# Patient Record
Sex: Female | Born: 1985 | Race: Asian | Hispanic: No | Marital: Married | Smoking: Former smoker
Health system: Southern US, Community
[De-identification: ages and names within clinical notes are randomized; demographics above are authoritative.]

## PROBLEM LIST (undated history)

## (undated) DIAGNOSIS — R51 Headache: Secondary | ICD-10-CM

## (undated) DIAGNOSIS — B019 Varicella without complication: Secondary | ICD-10-CM

## (undated) DIAGNOSIS — R519 Headache, unspecified: Secondary | ICD-10-CM

## (undated) DIAGNOSIS — G43909 Migraine, unspecified, not intractable, without status migrainosus: Secondary | ICD-10-CM

## (undated) HISTORY — PX: WISDOM TOOTH EXTRACTION: SHX21

## (undated) HISTORY — DX: Headache, unspecified: R51.9

## (undated) HISTORY — DX: Headache: R51

## (undated) HISTORY — DX: Migraine, unspecified, not intractable, without status migrainosus: G43.909

## (undated) HISTORY — DX: Varicella without complication: B01.9

---

## 2011-04-24 LAB — HM PAP SMEAR

## 2013-03-09 ENCOUNTER — Encounter: Payer: Self-pay | Admitting: Family Medicine

## 2013-05-01 ENCOUNTER — Encounter: Payer: Self-pay | Admitting: Family Medicine

## 2013-05-01 ENCOUNTER — Ambulatory Visit (INDEPENDENT_AMBULATORY_CARE_PROVIDER_SITE_OTHER): Payer: BC Managed Care – PPO | Admitting: Family Medicine

## 2013-05-01 VITALS — BP 108/64 | HR 61 | Temp 98.2°F | Ht 64.0 in | Wt 155.2 lb

## 2013-05-01 DIAGNOSIS — F17209 Nicotine dependence, unspecified, with unspecified nicotine-induced disorders: Secondary | ICD-10-CM

## 2013-05-01 DIAGNOSIS — Z0000 Encounter for general adult medical examination without abnormal findings: Secondary | ICD-10-CM

## 2013-05-01 DIAGNOSIS — F17200 Nicotine dependence, unspecified, uncomplicated: Secondary | ICD-10-CM | POA: Insufficient documentation

## 2013-05-01 NOTE — Patient Instructions (Addendum)
Preventive Care for Adults, Female A healthy lifestyle and preventive care can promote health and wellness. Preventive health guidelines for women include the following key practices.  A routine yearly physical is a good way to check with your caregiver about your health and preventive screening. It is a chance to share any concerns and updates on your health, and to receive a thorough exam.  Visit your dentist for a routine exam and preventive care every 6 months. Brush your teeth twice a day and floss once a day. Good oral hygiene prevents tooth decay and gum disease.  The frequency of eye exams is based on your age, health, family medical history, use of contact lenses, and other factors. Follow your caregiver's recommendations for frequency of eye exams.  Eat a healthy diet. Foods like vegetables, fruits, whole grains, low-fat dairy products, and lean protein foods contain the nutrients you need without too many calories. Decrease your intake of foods high in solid fats, added sugars, and salt. Eat the right amount of calories for you.Get information about a proper diet from your caregiver, if necessary.  Regular physical exercise is one of the most important things you can do for your health. Most adults should get at least 150 minutes of moderate-intensity exercise (any activity that increases your heart rate and causes you to sweat) each week. In addition, most adults need muscle-strengthening exercises on 2 or more days a week.  Maintain a healthy weight. The body mass index (BMI) is a screening tool to identify possible weight problems. It provides an estimate of body fat based on height and weight. Your caregiver can help determine your BMI, and can help you achieve or maintain a healthy weight.For adults 20 years and older:  A BMI below 18.5 is considered underweight.  A BMI of 18.5 to 24.9 is normal.  A BMI of 25 to 29.9 is considered overweight.  A BMI of 30 and above is  considered obese.  Maintain normal blood lipids and cholesterol levels by exercising and minimizing your intake of saturated fat. Eat a balanced diet with plenty of fruit and vegetables. Blood tests for lipids and cholesterol should begin at age 20 and be repeated every 5 years. If your lipid or cholesterol levels are high, you are over 50, or you are at high risk for heart disease, you may need your cholesterol levels checked more frequently.Ongoing high lipid and cholesterol levels should be treated with medicines if diet and exercise are not effective.  If you smoke, find out from your caregiver how to quit. If you do not use tobacco, do not start.  If you are pregnant, do not drink alcohol. If you are breastfeeding, be very cautious about drinking alcohol. If you are not pregnant and choose to drink alcohol, do not exceed 1 drink per day. One drink is considered to be 12 ounces (355 mL) of beer, 5 ounces (148 mL) of wine, or 1.5 ounces (44 mL) of liquor.  Avoid use of street drugs. Do not share needles with anyone. Ask for help if you need support or instructions about stopping the use of drugs.  High blood pressure causes heart disease and increases the risk of stroke. Your blood pressure should be checked at least every 1 to 2 years. Ongoing high blood pressure should be treated with medicines if weight loss and exercise are not effective.  If you are 55 to 25 years old, ask your caregiver if you should take aspirin to prevent strokes.  Diabetes   screening involves taking a blood sample to check your fasting blood sugar level. This should be done once every 3 years, after age 45, if you are within normal weight and without risk factors for diabetes. Testing should be considered at a younger age or be carried out more frequently if you are overweight and have at least 1 risk factor for diabetes.  Breast cancer screening is essential preventive care for women. You should practice "breast  self-awareness." This means understanding the normal appearance and feel of your breasts and may include breast self-examination. Any changes detected, no matter how small, should be reported to a caregiver. Women in their 20s and 30s should have a clinical breast exam (CBE) by a caregiver as part of a regular health exam every 1 to 3 years. After age 40, women should have a CBE every year. Starting at age 40, women should consider having a mammography (breast X-ray test) every year. Women who have a family history of breast cancer should talk to their caregiver about genetic screening. Women at a high risk of breast cancer should talk to their caregivers about having magnetic resonance imaging (MRI) and a mammography every year.  The Pap test is a screening test for cervical cancer. A Pap test can show cell changes on the cervix that might become cervical cancer if left untreated. A Pap test is a procedure in which cells are obtained and examined from the lower end of the uterus (cervix).  Women should have a Pap test starting at age 21.  Between ages 21 and 29, Pap tests should be repeated every 2 years.  Beginning at age 30, you should have a Pap test every 3 years as long as the past 3 Pap tests have been normal.  Some women have medical problems that increase the chance of getting cervical cancer. Talk to your caregiver about these problems. It is especially important to talk to your caregiver if a new problem develops soon after your last Pap test. In these cases, your caregiver may recommend more frequent screening and Pap tests.  The above recommendations are the same for women who have or have not gotten the vaccine for human papillomavirus (HPV).  If you had a hysterectomy for a problem that was not cancer or a condition that could lead to cancer, then you no longer need Pap tests. Even if you no longer need a Pap test, a regular exam is a good idea to make sure no other problems are  starting.  If you are between ages 65 and 70, and you have had normal Pap tests going back 10 years, you no longer need Pap tests. Even if you no longer need a Pap test, a regular exam is a good idea to make sure no other problems are starting.  If you have had past treatment for cervical cancer or a condition that could lead to cancer, you need Pap tests and screening for cancer for at least 20 years after your treatment.  If Pap tests have been discontinued, risk factors (such as a new sexual partner) need to be reassessed to determine if screening should be resumed.  The HPV test is an additional test that may be used for cervical cancer screening. The HPV test looks for the virus that can cause the cell changes on the cervix. The cells collected during the Pap test can be tested for HPV. The HPV test could be used to screen women aged 30 years and older, and should   be used in women of any age who have unclear Pap test results. After the age of 30, women should have HPV testing at the same frequency as a Pap test.  Colorectal cancer can be detected and often prevented. Most routine colorectal cancer screening begins at the age of 50 and continues through age 75. However, your caregiver may recommend screening at an earlier age if you have risk factors for colon cancer. On a yearly basis, your caregiver may provide home test kits to check for hidden blood in the stool. Use of a small camera at the end of a tube, to directly examine the colon (sigmoidoscopy or colonoscopy), can detect the earliest forms of colorectal cancer. Talk to your caregiver about this at age 50, when routine screening begins. Direct examination of the colon should be repeated every 5 to 10 years through age 75, unless early forms of pre-cancerous polyps or small growths are found.  Hepatitis C blood testing is recommended for all people born from 1945 through 1965 and any individual with known risks for hepatitis C.  Practice  safe sex. Use condoms and avoid high-risk sexual practices to reduce the spread of sexually transmitted infections (STIs). STIs include gonorrhea, chlamydia, syphilis, trichomonas, herpes, HPV, and human immunodeficiency virus (HIV). Herpes, HIV, and HPV are viral illnesses that have no cure. They can result in disability, cancer, and death. Sexually active women aged 25 and younger should be checked for chlamydia. Older women with new or multiple partners should also be tested for chlamydia. Testing for other STIs is recommended if you are sexually active and at increased risk.  Osteoporosis is a disease in which the bones lose minerals and strength with aging. This can result in serious bone fractures. The risk of osteoporosis can be identified using a bone density scan. Women ages 65 and over and women at risk for fractures or osteoporosis should discuss screening with their caregivers. Ask your caregiver whether you should take a calcium supplement or vitamin D to reduce the rate of osteoporosis.  Menopause can be associated with physical symptoms and risks. Hormone replacement therapy is available to decrease symptoms and risks. You should talk to your caregiver about whether hormone replacement therapy is right for you.  Use sunscreen with sun protection factor (SPF) of 30 or more. Apply sunscreen liberally and repeatedly throughout the day. You should seek shade when your shadow is shorter than you. Protect yourself by wearing long sleeves, pants, a wide-brimmed hat, and sunglasses year round, whenever you are outdoors.  Once a month, do a whole body skin exam, using a mirror to look at the skin on your back. Notify your caregiver of new moles, moles that have irregular borders, moles that are larger than a pencil eraser, or moles that have changed in shape or color.  Stay current with required immunizations.  Influenza. You need a dose every fall (or winter). The composition of the flu vaccine  changes each year, so being vaccinated once is not enough.  Pneumococcal polysaccharide. You need 1 to 2 doses if you smoke cigarettes or if you have certain chronic medical conditions. You need 1 dose at age 65 (or older) if you have never been vaccinated.  Tetanus, diphtheria, pertussis (Tdap, Td). Get 1 dose of Tdap vaccine if you are younger than age 65, are over 65 and have contact with an infant, are a healthcare worker, are pregnant, or simply want to be protected from whooping cough. After that, you need a Td   booster dose every 10 years. Consult your caregiver if you have not had at least 3 tetanus and diphtheria-containing shots sometime in your life or have a deep or dirty wound.  HPV. You need this vaccine if you are a woman age 26 or younger. The vaccine is given in 3 doses over 6 months.  Measles, mumps, rubella (MMR). You need at least 1 dose of MMR if you were born in 1957 or later. You may also need a second dose.  Meningococcal. If you are age 19 to 21 and a first-year college student living in a residence hall, or have one of several medical conditions, you need to get vaccinated against meningococcal disease. You may also need additional booster doses.  Zoster (shingles). If you are age 60 or older, you should get this vaccine.  Varicella (chickenpox). If you have never had chickenpox or you were vaccinated but received only 1 dose, talk to your caregiver to find out if you need this vaccine.  Hepatitis A. You need this vaccine if you have a specific risk factor for hepatitis A virus infection or you simply wish to be protected from this disease. The vaccine is usually given as 2 doses, 6 to 18 months apart.  Hepatitis B. You need this vaccine if you have a specific risk factor for hepatitis B virus infection or you simply wish to be protected from this disease. The vaccine is given in 3 doses, usually over 6 months. Preventive Services / Frequency Ages 19 to 39  Blood  pressure check.** / Every 1 to 2 years.  Lipid and cholesterol check.** / Every 5 years beginning at age 20.  Clinical breast exam.** / Every 3 years for women in their 20s and 30s.  Pap test.** / Every 2 years from ages 21 through 29. Every 3 years starting at age 30 through age 65 or 70 with a history of 3 consecutive normal Pap tests.  HPV screening.** / Every 3 years from ages 30 through ages 65 to 70 with a history of 3 consecutive normal Pap tests.  Hepatitis C blood test.** / For any individual with known risks for hepatitis C.  Skin self-exam. / Monthly.  Influenza immunization.** / Every year.  Pneumococcal polysaccharide immunization.** / 1 to 2 doses if you smoke cigarettes or if you have certain chronic medical conditions.  Tetanus, diphtheria, pertussis (Tdap, Td) immunization. / A one-time dose of Tdap vaccine. After that, you need a Td booster dose every 10 years.  HPV immunization. / 3 doses over 6 months, if you are 26 and younger.  Measles, mumps, rubella (MMR) immunization. / You need at least 1 dose of MMR if you were born in 1957 or later. You may also need a second dose.  Meningococcal immunization. / 1 dose if you are age 19 to 21 and a first-year college student living in a residence hall, or have one of several medical conditions, you need to get vaccinated against meningococcal disease. You may also need additional booster doses.  Varicella immunization.** / Consult your caregiver.  Hepatitis A immunization.** / Consult your caregiver. 2 doses, 6 to 18 months apart.  Hepatitis B immunization.** / Consult your caregiver. 3 doses usually over 6 months. Ages 40 to 64  Blood pressure check.** / Every 1 to 2 years.  Lipid and cholesterol check.** / Every 5 years beginning at age 20.  Clinical breast exam.** / Every year after age 40.  Mammogram.** / Every year beginning at age 40   and continuing for as long as you are in good health. Consult with your  caregiver.  Pap test.** / Every 3 years starting at age 30 through age 65 or 70 with a history of 3 consecutive normal Pap tests.  HPV screening.** / Every 3 years from ages 30 through ages 65 to 70 with a history of 3 consecutive normal Pap tests.  Fecal occult blood test (FOBT) of stool. / Every year beginning at age 50 and continuing until age 75. You may not need to do this test if you get a colonoscopy every 10 years.  Flexible sigmoidoscopy or colonoscopy.** / Every 5 years for a flexible sigmoidoscopy or every 10 years for a colonoscopy beginning at age 50 and continuing until age 75.  Hepatitis C blood test.** / For all people born from 1945 through 1965 and any individual with known risks for hepatitis C.  Skin self-exam. / Monthly.  Influenza immunization.** / Every year.  Pneumococcal polysaccharide immunization.** / 1 to 2 doses if you smoke cigarettes or if you have certain chronic medical conditions.  Tetanus, diphtheria, pertussis (Tdap, Td) immunization.** / A one-time dose of Tdap vaccine. After that, you need a Td booster dose every 10 years.  Measles, mumps, rubella (MMR) immunization. / You need at least 1 dose of MMR if you were born in 1957 or later. You may also need a second dose.  Varicella immunization.** / Consult your caregiver.  Meningococcal immunization.** / Consult your caregiver.  Hepatitis A immunization.** / Consult your caregiver. 2 doses, 6 to 18 months apart.  Hepatitis B immunization.** / Consult your caregiver. 3 doses, usually over 6 months. Ages 65 and over  Blood pressure check.** / Every 1 to 2 years.  Lipid and cholesterol check.** / Every 5 years beginning at age 20.  Clinical breast exam.** / Every year after age 40.  Mammogram.** / Every year beginning at age 40 and continuing for as long as you are in good health. Consult with your caregiver.  Pap test.** / Every 3 years starting at age 30 through age 65 or 70 with a 3  consecutive normal Pap tests. Testing can be stopped between 65 and 70 with 3 consecutive normal Pap tests and no abnormal Pap or HPV tests in the past 10 years.  HPV screening.** / Every 3 years from ages 30 through ages 65 or 70 with a history of 3 consecutive normal Pap tests. Testing can be stopped between 65 and 70 with 3 consecutive normal Pap tests and no abnormal Pap or HPV tests in the past 10 years.  Fecal occult blood test (FOBT) of stool. / Every year beginning at age 50 and continuing until age 75. You may not need to do this test if you get a colonoscopy every 10 years.  Flexible sigmoidoscopy or colonoscopy.** / Every 5 years for a flexible sigmoidoscopy or every 10 years for a colonoscopy beginning at age 50 and continuing until age 75.  Hepatitis C blood test.** / For all people born from 1945 through 1965 and any individual with known risks for hepatitis C.  Osteoporosis screening.** / A one-time screening for women ages 65 and over and women at risk for fractures or osteoporosis.  Skin self-exam. / Monthly.  Influenza immunization.** / Every year.  Pneumococcal polysaccharide immunization.** / 1 dose at age 65 (or older) if you have never been vaccinated.  Tetanus, diphtheria, pertussis (Tdap, Td) immunization. / A one-time dose of Tdap vaccine if you are over   65 and have contact with an infant, are a healthcare worker, or simply want to be protected from whooping cough. After that, you need a Td booster dose every 10 years.  Varicella immunization.** / Consult your caregiver.  Meningococcal immunization.** / Consult your caregiver.  Hepatitis A immunization.** / Consult your caregiver. 2 doses, 6 to 18 months apart.  Hepatitis B immunization.** / Check with your caregiver. 3 doses, usually over 6 months. ** Family history and personal history of risk and conditions may change your caregiver's recommendations. Document Released: 10/05/2001 Document Revised: 11/01/2011  Document Reviewed: 01/04/2011 [employer] Patient Information 2014 [employer].  

## 2013-05-01 NOTE — Progress Notes (Signed)
Subjective:     Melissa Robles is a 27 y.o. female and is here for a comprehensive physical exam. The patient reports no problems.  History   Social History  . Marital Status: Single    Spouse Name: N/A    Number of Children: N/A  . Years of Education: N/A   Occupational History  . Manufacturing engineer  . pharm tech    Social History Main Topics  . Smoking status: Current Every Day Smoker    Types: Cigarettes  . Smokeless tobacco: Never Used     Comment: 1-2 cig a week  . Alcohol Use: Yes     Comment: Socially--1 every 2-3 weeks  . Drug Use: 1.00 per week    Special: Marijuana     Comment: Marijuana occasionally  . Sexual Activity: Yes    Partners: Male    Birth Control/ Protection: Condom   Other Topics Concern  . Not on file   Social History Narrative   Exercise-- 2-3 x a week, cardio, weights   Health Maintenance  Topic Date Due  . Influenza Vaccine  05/15/2013  . Pap Smear  04/23/2014  . Tetanus/tdap  03/21/2016    The following portions of the patient's history were reviewed and updated as appropriate:  She  has a past medical history of Chicken pox; Frequent headaches; and Migraine. She  does not have a problem list on file. She  has past surgical history that includes Wisdom tooth extraction. Her family history includes Alcohol abuse in her brother, father, paternal grandmother, and paternal uncle; Sudden death in her paternal uncle; Sudden death (age of onset: 12) in her father. She  reports that she has been smoking Cigarettes.  She has been smoking about 0.00 packs per day. She has never used smokeless tobacco. She reports that  drinks alcohol. She reports that she uses illicit drugs (Marijuana) about once per week. She has a current medication list which includes the following prescription(s): multivitamin. No current outpatient prescriptions on file prior to visit.   No current facility-administered medications on file prior to visit.   She has No Known  Allergies..  Review of Systems Review of Systems  Constitutional: Negative for activity change, appetite change and fatigue.  HENT: Negative for hearing loss, congestion, tinnitus and ear discharge.  dentist q20m Eyes: Negative for visual disturbance (see optho q1y -- vision corrected to 20/20 with glasses).  Respiratory: Negative for cough, chest tightness and shortness of breath.   Cardiovascular: Negative for chest pain, palpitations and leg swelling.  Gastrointestinal: Negative for abdominal pain, diarrhea, constipation and abdominal distention.  Genitourinary: Negative for urgency, frequency, decreased urine volume and difficulty urinating.  Musculoskeletal: Negative for back pain, arthralgias and gait problem.  Skin: Negative for color change, pallor and rash.  Neurological: Negative for dizziness, light-headedness, numbness and headaches.  Hematological: Negative for adenopathy. Does not bruise/bleed easily.  Psychiatric/Behavioral: Negative for suicidal ideas, confusion, sleep disturbance, self-injury, dysphoric mood, decreased concentration and agitation.       Objective:    BP 108/64  Pulse 61  Temp(Src) 98.2 F (36.8 C) (Oral)  Ht 5\' 4"  (1.626 m)  Wt 155 lb 3.2 oz (70.398 kg)  BMI 26.63 kg/m2  SpO2 98%  LMP 05/01/2013 General appearance: alert, cooperative, appears stated age and no distress Head: Normocephalic, without obvious abnormality, atraumatic Eyes: conjunctivae/corneas clear. PERRL, EOM's intact. Fundi benign. Ears: normal TM's and external ear canals both ears Nose: Nares normal. Septum midline. Mucosa normal. No drainage or sinus  tenderness. Throat: lips, mucosa, and tongue normal; teeth and gums normal Neck: no adenopathy, no carotid bruit, no JVD, supple, symmetrical, trachea midline and thyroid not enlarged, symmetric, no tenderness/mass/nodules Back: symmetric, no curvature. ROM normal. No CVA tenderness. Lungs: clear to auscultation  bilaterally Breasts: deferred  Heart: regular rate and rhythm, S1, S2 normal, no murmur, click, rub or gallop Abdomen: soft, non-tender; bowel sounds normal; no masses,  no organomegaly Pelvic: deferred --to rto  Extremities: extremities normal, atraumatic, no cyanosis or edema Pulses: 2+ and symmetric Skin: Skin color, texture, turgor normal. No rashes or lesions Lymph nodes: Cervical, supraclavicular, and axillary nodes normal. Neurologic: Alert and oriented X 3, normal strength and tone. Normal symmetric reflexes. Normal coordination and gait Psych-- no anxiety, no depression      Assessment:    Healthy female exam.      Plan:    rto for breast and pap-- pt on period Will get labs then too See After Visit Summary for Counseling Recommendations

## 2013-05-17 ENCOUNTER — Encounter: Payer: Self-pay | Admitting: Family Medicine

## 2013-05-17 ENCOUNTER — Other Ambulatory Visit (HOSPITAL_COMMUNITY)
Admission: RE | Admit: 2013-05-17 | Discharge: 2013-05-17 | Disposition: A | Discharge status: Home / Self Care | Payer: BC Managed Care – PPO | Source: Ambulatory Visit | Attending: Family Medicine | Admitting: Family Medicine

## 2013-05-17 ENCOUNTER — Ambulatory Visit (INDEPENDENT_AMBULATORY_CARE_PROVIDER_SITE_OTHER): Payer: BC Managed Care – PPO | Admitting: Family Medicine

## 2013-05-17 VITALS — BP 110/60 | HR 81 | Temp 98.2°F | Wt 151.6 lb

## 2013-05-17 DIAGNOSIS — Z1151 Encounter for screening for human papillomavirus (HPV): Secondary | ICD-10-CM | POA: Insufficient documentation

## 2013-05-17 DIAGNOSIS — Z01419 Encounter for gynecological examination (general) (routine) without abnormal findings: Secondary | ICD-10-CM | POA: Insufficient documentation

## 2013-05-17 DIAGNOSIS — Z0000 Encounter for general adult medical examination without abnormal findings: Secondary | ICD-10-CM

## 2013-05-17 DIAGNOSIS — Z124 Encounter for screening for malignant neoplasm of cervix: Secondary | ICD-10-CM

## 2013-05-17 NOTE — Progress Notes (Signed)
  Subjective:     Melissa Robles is a 27 y.o. woman who comes in today for a  pap smear only. Her most recent annual exam was on 04/27/2013. Her most recent Pap smear was on 2013 and showed no abnormalities. Previous abnormal Pap smears: no. Contraception: condoms  The following portions of the patient's history were reviewed and updated as appropriate: allergies, current medications, past family history, past medical history, past social history, past surgical history and problem list.  Review of Systems Pertinent items are noted in HPI.   Objective:    BP 110/60  Pulse 81  Temp(Src) 98.2 F (36.8 C) (Oral)  Wt 151 lb 9.6 oz (68.765 kg)  BMI 26.01 kg/m2  SpO2 98%  LMP 05/01/2013 Pelvic Exam: cervix normal in appearance, external genitalia normal, no adnexal masses or tenderness, no bladder tenderness, no cervical motion tenderness, uterus normal size, shape, and consistency and vagina normal without discharge. Pap smear obtained.  Breast-- no nipple d/c, no dimpling, no masses palpated no axillary nodes Assessment:    Screening pap smear.   Plan:    Follow up in 1 year, or as indicated by Pap results.

## 2013-05-17 NOTE — Patient Instructions (Signed)

## 2013-05-18 LAB — HEPATIC FUNCTION PANEL
ALT: 15 U/L (ref 0–35)
AST: 23 U/L (ref 0–37)
Albumin: 4.4 g/dL (ref 3.5–5.2)
Alkaline Phosphatase: 40 U/L (ref 39–117)

## 2013-05-18 LAB — CBC WITH DIFFERENTIAL/PLATELET
Basophils Relative: 0.5 % (ref 0.0–3.0)
Eosinophils Relative: 0.5 % (ref 0.0–5.0)
HCT: 39.9 % (ref 36.0–46.0)
Hemoglobin: 13.6 g/dL (ref 12.0–15.0)
Lymphocytes Relative: 28.7 % (ref 12.0–46.0)
Lymphs Abs: 2 10*3/uL (ref 0.7–4.0)
Monocytes Relative: 3.5 % (ref 3.0–12.0)
Neutro Abs: 4.7 10*3/uL (ref 1.4–7.7)
RBC: 4.33 Mil/uL (ref 3.87–5.11)
RDW: 12.3 % (ref 11.5–14.6)
WBC: 7.1 10*3/uL (ref 4.5–10.5)

## 2013-05-18 LAB — BASIC METABOLIC PANEL
Calcium: 9.4 mg/dL (ref 8.4–10.5)
GFR: 81.74 mL/min (ref >60.00)
Glucose, Bld: 61 mg/dL — ABNORMAL LOW (ref 70–99)
Potassium: 3.7 mEq/L (ref 3.5–5.1)
Sodium: 138 mEq/L (ref 135–145)

## 2013-05-18 LAB — TSH: TSH: 0.57 u[IU]/mL (ref 0.35–5.50)

## 2013-05-18 LAB — LIPID PANEL: Cholesterol: 165 mg/dL (ref 0–200)

## 2013-05-21 LAB — POCT URINALYSIS DIPSTICK
Glucose, UA: NEGATIVE
Ketones, UA: NEGATIVE
Leukocytes, UA: NEGATIVE
Protein, UA: NEGATIVE
Spec Grav, UA: <=1.005
Urobilinogen, UA: 0.2

## 2014-04-09 ENCOUNTER — Ambulatory Visit: Payer: BC Managed Care – PPO | Admitting: Family Medicine

## 2014-04-16 ENCOUNTER — Ambulatory Visit (INDEPENDENT_AMBULATORY_CARE_PROVIDER_SITE_OTHER): Payer: 59 | Admitting: Family Medicine

## 2014-04-16 ENCOUNTER — Encounter: Payer: Self-pay | Admitting: Family Medicine

## 2014-04-16 VITALS — BP 108/62 | HR 80 | Temp 98.6°F | Wt 156.8 lb

## 2014-04-16 DIAGNOSIS — A09 Infectious gastroenteritis and colitis, unspecified: Secondary | ICD-10-CM

## 2014-04-16 NOTE — Patient Instructions (Signed)

## 2014-04-16 NOTE — Progress Notes (Signed)
   Subjective:    Patient ID: Melissa Robles, female    DOB: 08-16-1986, 28 y.o.   MRN: [id number]  HPI Pt here to f/u travelers diarrhea.  [place] and was told the water was bottled but there was ice as well.  The entire bridal party and a lot of other guest had NVD.   That was two weeks ago.  The symptoms have since resolved.   Review of Systems As above    Objective:   Physical Exam  BP 108/62  Pulse 80  Temp(Src) 98.6 F (37 C) (Oral)  Wt 156 lb 12.8 oz (71.124 kg)  SpO2 98%  LMP 03/19/2014 General appearance: alert, cooperative and no distress Lungs: clear to auscultation bilaterally Abdomen: soft, non-tender; bowel sounds normal; no masses,  no organomegaly Extremities: extremities normal, atraumatic, no cyanosis or edema       Assessment & Plan:  1. Traveler's diarrhea Resolved rto prn

## 2014-04-16 NOTE — Assessment & Plan Note (Signed)
Pt symptoms are gone No more NVD

## 2014-04-16 NOTE — Progress Notes (Signed)
Pre visit review using our clinic review tool, if applicable. No additional management support is needed unless otherwise documented below in the visit note. 

## 2014-06-06 ENCOUNTER — Encounter: Payer: Self-pay | Admitting: Family Medicine

## 2014-06-24 ENCOUNTER — Encounter: Payer: Self-pay | Admitting: Family Medicine

## 2014-06-24 ENCOUNTER — Ambulatory Visit (INDEPENDENT_AMBULATORY_CARE_PROVIDER_SITE_OTHER): Payer: 59 | Admitting: Family Medicine

## 2014-06-24 VITALS — BP 102/60 | HR 71 | Temp 99.6°F | Ht 64.0 in | Wt 160.4 lb

## 2014-06-24 DIAGNOSIS — Z0000 Encounter for general adult medical examination without abnormal findings: Secondary | ICD-10-CM

## 2014-06-24 DIAGNOSIS — M25531 Pain in right wrist: Secondary | ICD-10-CM

## 2014-06-24 DIAGNOSIS — M25561 Pain in right knee: Secondary | ICD-10-CM

## 2014-06-24 NOTE — Patient Instructions (Signed)
Preventive Care for Adults A healthy lifestyle and preventive care can promote health and wellness. Preventive health guidelines for women include the following key practices.  A routine yearly physical is a good way to check with your health care provider about your health and preventive screening. It is a chance to share any concerns and updates on your health and to receive a thorough exam.  Visit your dentist for a routine exam and preventive care every 6 months. Brush your teeth twice a day and floss once a day. Good oral hygiene prevents tooth decay and gum disease.  The frequency of eye exams is based on your age, health, family medical history, use of contact lenses, and other factors. Follow your health care provider's recommendations for frequency of eye exams.  Eat a healthy diet. Foods like vegetables, fruits, whole grains, low-fat dairy products, and lean protein foods contain the nutrients you need without too many calories. Decrease your intake of foods high in solid fats, added sugars, and salt. Eat the right amount of calories for you.Get information about a proper diet from your health care provider, if necessary.  Regular physical exercise is one of the most important things you can do for your health. Most adults should get at least 150 minutes of moderate-intensity exercise (any activity that increases your heart rate and causes you to sweat) each week. In addition, most adults need muscle-strengthening exercises on 2 or more days a week.  Maintain a healthy weight. The body mass index (BMI) is a screening tool to identify possible weight problems. It provides an estimate of body fat based on height and weight. Your health care provider can find your BMI and can help you achieve or maintain a healthy weight.For adults 20 years and older:  A BMI below 18.5 is considered underweight.  A BMI of 18.5 to 24.9 is normal.  A BMI of 25 to 29.9 is considered overweight.  A BMI of  30 and above is considered obese.  Maintain normal blood lipids and cholesterol levels by exercising and minimizing your intake of saturated fat. Eat a balanced diet with plenty of fruit and vegetables. Blood tests for lipids and cholesterol should begin at age 76 and be repeated every 5 years. If your lipid or cholesterol levels are high, you are over 50, or you are at high risk for heart disease, you may need your cholesterol levels checked more frequently.Ongoing high lipid and cholesterol levels should be treated with medicines if diet and exercise are not working.  If you smoke, find out from your health care provider how to quit. If you do not use tobacco, do not start.  Lung cancer screening is recommended for adults aged 22-80 years who are at high risk for developing lung cancer because of a history of smoking. A yearly low-dose CT scan of the lungs is recommended for people who have at least a 30-pack-year history of smoking and are a current smoker or have quit within the past 15 years. A pack year of smoking is smoking an average of 1 pack of cigarettes a day for 1 year (for example: 1 pack a day for 30 years or 2 packs a day for 15 years). Yearly screening should continue until the smoker has stopped smoking for at least 15 years. Yearly screening should be stopped for people who develop a health problem that would prevent them from having lung cancer treatment.  If you are pregnant, do not drink alcohol. If you are breastfeeding,  be very cautious about drinking alcohol. If you are not pregnant and choose to drink alcohol, do not have more than 1 drink per day. One drink is considered to be 12 ounces (355 mL) of beer, 5 ounces (148 mL) of wine, or 1.5 ounces (44 mL) of liquor.  Avoid use of street drugs. Do not share needles with anyone. Ask for help if you need support or instructions about stopping the use of drugs.  High blood pressure causes heart disease and increases the risk of  stroke. Your blood pressure should be checked at least every 1 to 2 years. Ongoing high blood pressure should be treated with medicines if weight loss and exercise do not work.  If you are 75-52 years old, ask your health care provider if you should take aspirin to prevent strokes.  Diabetes screening involves taking a blood sample to check your fasting blood sugar level. This should be done once every 3 years, after age 15, if you are within normal weight and without risk factors for diabetes. Testing should be considered at a younger age or be carried out more frequently if you are overweight and have at least 1 risk factor for diabetes.  Breast cancer screening is essential preventive care for women. You should practice "breast self-awareness." This means understanding the normal appearance and feel of your breasts and may include breast self-examination. Any changes detected, no matter how small, should be reported to a health care provider. Women in their 58s and 30s should have a clinical breast exam (CBE) by a health care provider as part of a regular health exam every 1 to 3 years. After age 16, women should have a CBE every year. Starting at age 53, women should consider having a mammogram (breast X-ray test) every year. Women who have a family history of breast cancer should talk to their health care provider about genetic screening. Women at a high risk of breast cancer should talk to their health care providers about having an MRI and a mammogram every year.  Breast cancer gene (BRCA)-related cancer risk assessment is recommended for women who have family members with BRCA-related cancers. BRCA-related cancers include breast, ovarian, tubal, and peritoneal cancers. Having family members with these cancers may be associated with an increased risk for harmful changes (mutations) in the breast cancer genes BRCA1 and BRCA2. Results of the assessment will determine the need for genetic counseling and  BRCA1 and BRCA2 testing.  Routine pelvic exams to screen for cancer are no longer recommended for nonpregnant women who are considered low risk for cancer of the pelvic organs (ovaries, uterus, and vagina) and who do not have symptoms. Ask your health care provider if a screening pelvic exam is right for you.  If you have had past treatment for cervical cancer or a condition that could lead to cancer, you need Pap tests and screening for cancer for at least 20 years after your treatment. If Pap tests have been discontinued, your risk factors (such as having a new sexual partner) need to be reassessed to determine if screening should be resumed. Some women have medical problems that increase the chance of getting cervical cancer. In these cases, your health care provider may recommend more frequent screening and Pap tests.  The HPV test is an additional test that may be used for cervical cancer screening. The HPV test looks for the virus that can cause the cell changes on the cervix. The cells collected during the Pap test can be  tested for HPV. The HPV test could be used to screen women aged 30 years and older, and should be used in women of any age who have unclear Pap test results. After the age of 30, women should have HPV testing at the same frequency as a Pap test.  Colorectal cancer can be detected and often prevented. Most routine colorectal cancer screening begins at the age of 50 years and continues through age 75 years. However, your health care provider may recommend screening at an earlier age if you have risk factors for colon cancer. On a yearly basis, your health care provider may provide home test kits to check for hidden blood in the stool. Use of a small camera at the end of a tube, to directly examine the colon (sigmoidoscopy or colonoscopy), can detect the earliest forms of colorectal cancer. Talk to your health care provider about this at age 50, when routine screening begins. Direct  exam of the colon should be repeated every 5-10 years through age 75 years, unless early forms of pre-cancerous polyps or small growths are found.  People who are at an increased risk for hepatitis B should be screened for this virus. You are considered at high risk for hepatitis B if:  You were born in a country where hepatitis B occurs often. Talk with your health care provider about which countries are considered high risk.  Your parents were born in a high-risk country and you have not received a shot to protect against hepatitis B (hepatitis B vaccine).  You have HIV or AIDS.  You use needles to inject street drugs.  You live with, or have sex with, someone who has hepatitis B.  You get hemodialysis treatment.  You take certain medicines for conditions like cancer, organ transplantation, and autoimmune conditions.  Hepatitis C blood testing is recommended for all people born from 1945 through 1965 and any individual with known risks for hepatitis C.  Practice safe sex. Use condoms and avoid high-risk sexual practices to reduce the spread of sexually transmitted infections (STIs). STIs include gonorrhea, chlamydia, syphilis, trichomonas, herpes, HPV, and human immunodeficiency virus (HIV). Herpes, HIV, and HPV are viral illnesses that have no cure. They can result in disability, cancer, and death.  You should be screened for sexually transmitted illnesses (STIs) including gonorrhea and chlamydia if:  You are sexually active and are younger than 24 years.  You are older than 24 years and your health care provider tells you that you are at risk for this type of infection.  Your sexual activity has changed since you were last screened and you are at an increased risk for chlamydia or gonorrhea. Ask your health care provider if you are at risk.  If you are at risk of being infected with HIV, it is recommended that you take a prescription medicine daily to prevent HIV infection. This is  called preexposure prophylaxis (PrEP). You are considered at risk if:  You are a heterosexual woman, are sexually active, and are at increased risk for HIV infection.  You take drugs by injection.  You are sexually active with a partner who has HIV.  Talk with your health care provider about whether you are at high risk of being infected with HIV. If you choose to begin PrEP, you should first be tested for HIV. You should then be tested every 3 months for as long as you are taking PrEP.  Osteoporosis is a disease in which the bones lose minerals and strength   with aging. This can result in serious bone fractures or breaks. The risk of osteoporosis can be identified using a bone density scan. Women ages 65 years and over and women at risk for fractures or osteoporosis should discuss screening with their health care providers. Ask your health care provider whether you should take a calcium supplement or vitamin D to reduce the rate of osteoporosis.  Menopause can be associated with physical symptoms and risks. Hormone replacement therapy is available to decrease symptoms and risks. You should talk to your health care provider about whether hormone replacement therapy is right for you.  Use sunscreen. Apply sunscreen liberally and repeatedly throughout the day. You should seek shade when your shadow is shorter than you. Protect yourself by wearing long sleeves, pants, a wide-brimmed hat, and sunglasses year round, whenever you are outdoors.  Once a month, do a whole body skin exam, using a mirror to look at the skin on your back. Tell your health care provider of new moles, moles that have irregular borders, moles that are larger than a pencil eraser, or moles that have changed in shape or color.  Stay current with required vaccines (immunizations).  Influenza vaccine. All adults should be immunized every year.  Tetanus, diphtheria, and acellular pertussis (Td, Tdap) vaccine. Pregnant women should  receive 1 dose of Tdap vaccine during each pregnancy. The dose should be obtained regardless of the length of time since the last dose. Immunization is preferred during the 27th-36th week of gestation. An adult who has not previously received Tdap or who does not know her vaccine status should receive 1 dose of Tdap. This initial dose should be followed by tetanus and diphtheria toxoids (Td) booster doses every 10 years. Adults with an unknown or incomplete history of completing a 3-dose immunization series with Td-containing vaccines should begin or complete a primary immunization series including a Tdap dose. Adults should receive a Td booster every 10 years.  Varicella vaccine. An adult without evidence of immunity to varicella should receive 2 doses or a second dose if she has previously received 1 dose. Pregnant females who do not have evidence of immunity should receive the first dose after pregnancy. This first dose should be obtained before leaving the health care facility. The second dose should be obtained 4-8 weeks after the first dose.  Human papillomavirus (HPV) vaccine. Females aged 13-26 years who have not received the vaccine previously should obtain the 3-dose series. The vaccine is not recommended for use in pregnant females. However, pregnancy testing is not needed before receiving a dose. If a female is found to be pregnant after receiving a dose, no treatment is needed. In that case, the remaining doses should be delayed until after the pregnancy. Immunization is recommended for any person with an immunocompromised condition through the age of 26 years if she did not get any or all doses earlier. During the 3-dose series, the second dose should be obtained 4-8 weeks after the first dose. The third dose should be obtained 24 weeks after the first dose and 16 weeks after the second dose.  Zoster vaccine. One dose is recommended for adults aged 60 years or older unless certain conditions are  present.  Measles, mumps, and rubella (MMR) vaccine. Adults born before 1957 generally are considered immune to measles and mumps. Adults born in 1957 or later should have 1 or more doses of MMR vaccine unless there is a contraindication to the vaccine or there is laboratory evidence of immunity to   each of the three diseases. A routine second dose of MMR vaccine should be obtained at least 28 days after the first dose for students attending postsecondary schools, health care workers, or international travelers. People who received inactivated measles vaccine or an unknown type of measles vaccine during 1963-1967 should receive 2 doses of MMR vaccine. People who received inactivated mumps vaccine or an unknown type of mumps vaccine before 1979 and are at high risk for mumps infection should consider immunization with 2 doses of MMR vaccine. For females of childbearing age, rubella immunity should be determined. If there is no evidence of immunity, females who are not pregnant should be vaccinated. If there is no evidence of immunity, females who are pregnant should delay immunization until after pregnancy. Unvaccinated health care workers born before 1957 who lack laboratory evidence of measles, mumps, or rubella immunity or laboratory confirmation of disease should consider measles and mumps immunization with 2 doses of MMR vaccine or rubella immunization with 1 dose of MMR vaccine.  Pneumococcal 13-valent conjugate (PCV13) vaccine. When indicated, a person who is uncertain of her immunization history and has no record of immunization should receive the PCV13 vaccine. An adult aged 19 years or older who has certain medical conditions and has not been previously immunized should receive 1 dose of PCV13 vaccine. This PCV13 should be followed with a dose of pneumococcal polysaccharide (PPSV23) vaccine. The PPSV23 vaccine dose should be obtained at least 8 weeks after the dose of PCV13 vaccine. An adult aged 19  years or older who has certain medical conditions and previously received 1 or more doses of PPSV23 vaccine should receive 1 dose of PCV13. The PCV13 vaccine dose should be obtained 1 or more years after the last PPSV23 vaccine dose.  Pneumococcal polysaccharide (PPSV23) vaccine. When PCV13 is also indicated, PCV13 should be obtained first. All adults aged 65 years and older should be immunized. An adult younger than age 65 years who has certain medical conditions should be immunized. Any person who resides in a nursing home or long-term care facility should be immunized. An adult smoker should be immunized. People with an immunocompromised condition and certain other conditions should receive both PCV13 and PPSV23 vaccines. People with human immunodeficiency virus (HIV) infection should be immunized as soon as possible after diagnosis. Immunization during chemotherapy or radiation therapy should be avoided. Routine use of PPSV23 vaccine is not recommended for American Indians, [place] Natives, or people younger than 65 years unless there are medical conditions that require PPSV23 vaccine. When indicated, people who have unknown immunization and have no record of immunization should receive PPSV23 vaccine. One-time revaccination 5 years after the first dose of PPSV23 is recommended for people aged 19-64 years who have chronic kidney failure, nephrotic syndrome, asplenia, or immunocompromised conditions. People who received 1-2 doses of PPSV23 before age 65 years should receive another dose of PPSV23 vaccine at age 65 years or later if at least 5 years have passed since the previous dose. Doses of PPSV23 are not needed for people immunized with PPSV23 at or after age 65 years.  Meningococcal vaccine. Adults with asplenia or persistent complement component deficiencies should receive 2 doses of quadrivalent meningococcal conjugate (MenACWY-D) vaccine. The doses should be obtained at least 2 months apart.  Microbiologists working with certain meningococcal bacteria, military recruits, people at risk during an outbreak, and people who travel to or live in countries with a high rate of meningitis should be immunized. A first-year college student up through age   21 years who is living in a residence hall should receive a dose if she did not receive a dose on or after her 16th birthday. Adults who have certain high-risk conditions should receive one or more doses of vaccine.  Hepatitis A vaccine. Adults who wish to be protected from this disease, have certain high-risk conditions, work with hepatitis A-infected animals, work in hepatitis A research labs, or travel to or work in countries with a high rate of hepatitis A should be immunized. Adults who were previously unvaccinated and who anticipate close contact with an international adoptee during the first 60 days after arrival in the [place] from a country with a high rate of hepatitis A should be immunized.  Hepatitis B vaccine. Adults who wish to be protected from this disease, have certain high-risk conditions, may be exposed to blood or other infectious body fluids, are household contacts or sex partners of hepatitis B positive people, are clients or workers in certain care facilities, or travel to or work in countries with a high rate of hepatitis B should be immunized.  Haemophilus influenzae type b (Hib) vaccine. A previously unvaccinated person with asplenia or sickle cell disease or having a scheduled splenectomy should receive 1 dose of Hib vaccine. Regardless of previous immunization, a recipient of a hematopoietic stem cell transplant should receive a 3-dose series 6-12 months after her successful transplant. Hib vaccine is not recommended for adults with HIV infection. Preventive Services / Frequency Ages 64 to 68 years  Blood pressure check.** / Every 1 to 2 years.  Lipid and cholesterol check.** / Every 5 years beginning at age  22.  Clinical breast exam.** / Every 3 years for women in their 88s and 53s.  BRCA-related cancer risk assessment.** / For women who have family members with a BRCA-related cancer (breast, ovarian, tubal, or peritoneal cancers).  Pap test.** / Every 2 years from ages 90 through 51. Every 3 years starting at age 21 through age 56 or 3 with a history of 3 consecutive normal Pap tests.  HPV screening.** / Every 3 years from ages 24 through ages 1 to 46 with a history of 3 consecutive normal Pap tests.  Hepatitis C blood test.** / For any individual with known risks for hepatitis C.  Skin self-exam. / Monthly.  Influenza vaccine. / Every year.  Tetanus, diphtheria, and acellular pertussis (Tdap, Td) vaccine.** / Consult your health care provider. Pregnant women should receive 1 dose of Tdap vaccine during each pregnancy. 1 dose of Td every 10 years.  Varicella vaccine.** / Consult your health care provider. Pregnant females who do not have evidence of immunity should receive the first dose after pregnancy.  HPV vaccine. / 3 doses over 6 months, if 72 and younger. The vaccine is not recommended for use in pregnant females. However, pregnancy testing is not needed before receiving a dose.  Measles, mumps, rubella (MMR) vaccine.** / You need at least 1 dose of MMR if you were born in 1957 or later. You may also need a 2nd dose. For females of childbearing age, rubella immunity should be determined. If there is no evidence of immunity, females who are not pregnant should be vaccinated. If there is no evidence of immunity, females who are pregnant should delay immunization until after pregnancy.  Pneumococcal 13-valent conjugate (PCV13) vaccine.** / Consult your health care provider.  Pneumococcal polysaccharide (PPSV23) vaccine.** / 1 to 2 doses if you smoke cigarettes or if you have certain conditions.  Meningococcal vaccine.** /  1 dose if you are age 19 to 21 years and a first-year college  student living in a residence hall, or have one of several medical conditions, you need to get vaccinated against meningococcal disease. You may also need additional booster doses.  Hepatitis A vaccine.** / Consult your health care provider.  Hepatitis B vaccine.** / Consult your health care provider.  Haemophilus influenzae type b (Hib) vaccine.** / Consult your health care provider. Ages 40 to 64 years  Blood pressure check.** / Every 1 to 2 years.  Lipid and cholesterol check.** / Every 5 years beginning at age 20 years.  Lung cancer screening. / Every year if you are aged 55-80 years and have a 30-pack-year history of smoking and currently smoke or have quit within the past 15 years. Yearly screening is stopped once you have quit smoking for at least 15 years or develop a health problem that would prevent you from having lung cancer treatment.  Clinical breast exam.** / Every year after age 40 years.  BRCA-related cancer risk assessment.** / For women who have family members with a BRCA-related cancer (breast, ovarian, tubal, or peritoneal cancers).  Mammogram.** / Every year beginning at age 40 years and continuing for as long as you are in good health. Consult with your health care provider.  Pap test.** / Every 3 years starting at age 30 years through age 65 or 70 years with a history of 3 consecutive normal Pap tests.  HPV screening.** / Every 3 years from ages 30 years through ages 65 to 70 years with a history of 3 consecutive normal Pap tests.  Fecal occult blood test (FOBT) of stool. / Every year beginning at age 50 years and continuing until age 75 years. You may not need to do this test if you get a colonoscopy every 10 years.  Flexible sigmoidoscopy or colonoscopy.** / Every 5 years for a flexible sigmoidoscopy or every 10 years for a colonoscopy beginning at age 50 years and continuing until age 75 years.  Hepatitis C blood test.** / For all people born from 1945 through  1965 and any individual with known risks for hepatitis C.  Skin self-exam. / Monthly.  Influenza vaccine. / Every year.  Tetanus, diphtheria, and acellular pertussis (Tdap/Td) vaccine.** / Consult your health care provider. Pregnant women should receive 1 dose of Tdap vaccine during each pregnancy. 1 dose of Td every 10 years.  Varicella vaccine.** / Consult your health care provider. Pregnant females who do not have evidence of immunity should receive the first dose after pregnancy.  Zoster vaccine.** / 1 dose for adults aged 60 years or older.  Measles, mumps, rubella (MMR) vaccine.** / You need at least 1 dose of MMR if you were born in 1957 or later. You may also need a 2nd dose. For females of childbearing age, rubella immunity should be determined. If there is no evidence of immunity, females who are not pregnant should be vaccinated. If there is no evidence of immunity, females who are pregnant should delay immunization until after pregnancy.  Pneumococcal 13-valent conjugate (PCV13) vaccine.** / Consult your health care provider.  Pneumococcal polysaccharide (PPSV23) vaccine.** / 1 to 2 doses if you smoke cigarettes or if you have certain conditions.  Meningococcal vaccine.** / Consult your health care provider.  Hepatitis A vaccine.** / Consult your health care provider.  Hepatitis B vaccine.** / Consult your health care provider.  Haemophilus influenzae type b (Hib) vaccine.** / Consult your health care provider. Ages 65   years and over  Blood pressure check.** / Every 1 to 2 years.  Lipid and cholesterol check.** / Every 5 years beginning at age 22 years.  Lung cancer screening. / Every year if you are aged 73-80 years and have a 30-pack-year history of smoking and currently smoke or have quit within the past 15 years. Yearly screening is stopped once you have quit smoking for at least 15 years or develop a health problem that would prevent you from having lung cancer  treatment.  Clinical breast exam.** / Every year after age 4 years.  BRCA-related cancer risk assessment.** / For women who have family members with a BRCA-related cancer (breast, ovarian, tubal, or peritoneal cancers).  Mammogram.** / Every year beginning at age 40 years and continuing for as long as you are in good health. Consult with your health care provider.  Pap test.** / Every 3 years starting at age 9 years through age 34 or 91 years with 3 consecutive normal Pap tests. Testing can be stopped between 65 and 70 years with 3 consecutive normal Pap tests and no abnormal Pap or HPV tests in the past 10 years.  HPV screening.** / Every 3 years from ages 57 years through ages 64 or 45 years with a history of 3 consecutive normal Pap tests. Testing can be stopped between 65 and 70 years with 3 consecutive normal Pap tests and no abnormal Pap or HPV tests in the past 10 years.  Fecal occult blood test (FOBT) of stool. / Every year beginning at age 15 years and continuing until age 17 years. You may not need to do this test if you get a colonoscopy every 10 years.  Flexible sigmoidoscopy or colonoscopy.** / Every 5 years for a flexible sigmoidoscopy or every 10 years for a colonoscopy beginning at age 86 years and continuing until age 71 years.  Hepatitis C blood test.** / For all people born from [birth year] through 1963 and any individual with known risks for hepatitis C.  Osteoporosis screening.** / A one-time screening for women ages 83 years and over and women at risk for fractures or osteoporosis.  Skin self-exam. / Monthly.  Influenza vaccine. / Every year.  Tetanus, diphtheria, and acellular pertussis (Tdap/Td) vaccine.** / 1 dose of Td every 10 years.  Varicella vaccine.** / Consult your health care provider.  Zoster vaccine.** / 1 dose for adults aged 61 years or older.  Pneumococcal 13-valent conjugate (PCV13) vaccine.** / Consult your health care provider.  Pneumococcal  polysaccharide (PPSV23) vaccine.** / 1 dose for all adults aged 28 years and older.  Meningococcal vaccine.** / Consult your health care provider.  Hepatitis A vaccine.** / Consult your health care provider.  Hepatitis B vaccine.** / Consult your health care provider.  Haemophilus influenzae type b (Hib) vaccine.** / Consult your health care provider. ** Family history and personal history of risk and conditions may change your health care provider's recommendations. Document Released: 10/05/2001 Document Revised: 12/24/2013 Document Reviewed: 01/04/2011 [facility] Patient Information 2015 [place], [place]. This information is not intended to replace advice given to you by your health care provider. Make sure you discuss any questions you have with your health care provider.

## 2014-06-24 NOTE — Progress Notes (Signed)
Subjective:     Melissa Robles is a 28 y.o. female and is here for a comprehensive physical exam. The patient reports problems - uri.,   R flank pain last for a second than gone.  She is also c/o of R knee pain x several years.  No known injury.  It feels like it is going to give out on her when she walks.  Pt also c/o pain in R wrist pain.  No numbness/ tingling in fingers.  History   Social History  . Marital Status: Single    Spouse Name: N/A    Number of Children: N/A  . 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 Smoking status: Former Smoker    Types: Cigarettes    Quit date: 01/14/2014  . Smokeless tobacco: Never Used     Comment: 1-2 cig a week  . Alcohol Use: 0.0 oz/week    0 Not specified per week     Comment: Socially--1 every 2-3 weeks  . Drug Use: No     Comment: Marijuana occasionally--stopped  . Sexual Activity:    Partners: Male    Birth Control/ Protection: Condom   Other Topics Concern  . Not on file   Social History Narrative   Exercise-- no   Health Maintenance  Topic Date Due  . INFLUENZA VACCINE  03/24/2015  . TETANUS/TDAP  03/21/2016  . PAP SMEAR  05/17/2016    The following portions of the patient's history were reviewed and updated as appropriate:  She  has a past medical history of Chicken pox; Frequent headaches; and Migraine. She  does not have any pertinent problems on file. She  has past surgical history that includes Wisdom tooth extraction. Her family history includes Alcohol abuse in her brother, father, paternal grandmother, and paternal uncle; Sudden death in her paternal uncle; Sudden death (age of onset: 45) in her father. She  reports that she quit smoking about 5 months ago. Her smoking use included Cigarettes. She smoked 0.00 packs per day. She has never used smokeless tobacco. She reports that she drinks alcohol. She reports that she does not use  illicit drugs. She has a current medication list which includes the following prescription(s): multivitamin. Current Outpatient Prescriptions on File Prior to Visit  Medication Sig Dispense Refill  . Multiple Vitamin (MULTIVITAMIN) tablet Take 1 tablet by mouth daily.     No current facility-administered medications on file prior to visit.   She has No Known Allergies..  Review of Systems Review of Systems  Constitutional: Negative for activity change, appetite change and fatigue.  HENT: Negative for hearing loss, congestion, tinnitus and ear discharge.  dentist q54m Eyes: Negative for visual disturbance (see optho - no) Respiratory: Negative for cough, chest tightness and shortness of breath.   Cardiovascular: Negative for chest pain, palpitations and leg swelling.  Gastrointestinal: Negative for abdominal pain, diarrhea, constipation and abdominal distention.  Genitourinary: Negative for urgency, frequency, decreased urine volume and difficulty urinating.  Musculoskeletal: Negative for back pain, arthralgias and gait problem.  Skin: Negative for color change, pallor and rash.  Neurological: Negative for dizziness, light-headedness, numbness and headaches.  Hematological: Negative for adenopathy. Does not bruise/bleed easily.  Psychiatric/Behavioral: Negative for suicidal ideas, confusion, sleep disturbance, self-injury, dysphoric mood, decreased concentration and agitation.       Objective:    BP 102/60 mmHg  Pulse 71  Temp(Src) 99.6 F (37.6 C) (Oral)  Ht 5\' 4"  (1.626 m)  Wt 160 lb 6.4 oz (72.757 kg)  BMI 27.52 kg/m2  SpO2 98%  LMP 05/26/2014 General appearance: alert, cooperative, appears stated age and no distress Head: Normocephalic, without obvious abnormality, atraumatic Eyes: conjunctivae/corneas clear. PERRL, EOM's intact. Fundi benign. Ears: normal TM's and external ear canals both ears Nose: Nares normal. Septum midline. Mucosa normal. No drainage or sinus  tenderness. Throat: lips, mucosa, and tongue normal; teeth and gums normal Neck: no adenopathy, supple, symmetrical, trachea midline and thyroid not enlarged, symmetric, no tenderness/mass/nodules Back: symmetric, no curvature. ROM normal. No CVA tenderness. Lungs: clear to auscultation bilaterally Breasts: normal appearance, no masses or tenderness Heart: S1, S2 normal Abdomen: soft, non-tender; bowel sounds normal; no masses,  no organomegaly Pelvic: deferred Extremities: R knee + crepitus , no pain with palpation, R wrist-- no pain with palpation, no numbness in fingers with flexion of wrist Pulses: 2+ and symmetric Skin: Skin color, texture, turgor normal. No rashes or lesions Lymph nodes: Cervical, supraclavicular, and axillary nodes normal. Neurologic: Alert and oriented X 3, normal strength and tone. Normal symmetric reflexes. Normal coordination and gait Psych- no depression, no anxiety      Assessment:    Healthy female exam.      Plan:     ghm utd Check labs See After Visit Summary for Counseling Recommendations    1. Preventative health care   - Basic metabolic panel; Future - CBC with Differential; Future - Hepatic function panel; Future - Lipid panel; Future - POCT urinalysis dipstick; Future - TSH; Future  2. Reocurring knee pain, right No known injury, con't sleeve - Ambulatory referral to Sports Medicine  3. Right wrist pain Pt does not want splint,  Doubt carpal tunnel - Ambulatory referral to Sports Medicine

## 2014-06-24 NOTE — Progress Notes (Signed)
Pre visit review using our clinic review tool, if applicable. No additional management support is needed unless otherwise documented below in the visit note. 

## 2014-06-27 ENCOUNTER — Other Ambulatory Visit (INDEPENDENT_AMBULATORY_CARE_PROVIDER_SITE_OTHER): Payer: 59

## 2014-06-27 DIAGNOSIS — Z0000 Encounter for general adult medical examination without abnormal findings: Secondary | ICD-10-CM

## 2014-06-27 LAB — CBC WITH DIFFERENTIAL/PLATELET
BASOS PCT: 0.7 % (ref 0.0–3.0)
Basophils Absolute: 0 10*3/uL (ref 0.0–0.1)
Eosinophils Absolute: 0.1 10*3/uL (ref 0.0–0.7)
Eosinophils Relative: 1.2 % (ref 0.0–5.0)
HCT: 38.4 % (ref 36.0–46.0)
HEMOGLOBIN: 12.8 g/dL (ref 12.0–15.0)
Lymphocytes Relative: 27.6 % (ref 12.0–46.0)
Lymphs Abs: 2 10*3/uL (ref 0.7–4.0)
MCHC: 33.4 g/dL (ref 30.0–36.0)
MCV: 91 fl (ref 78.0–100.0)
Monocytes Absolute: 0.5 10*3/uL (ref 0.1–1.0)
Monocytes Relative: 7.3 % (ref 3.0–12.0)
NEUTROS ABS: 4.5 10*3/uL (ref 1.4–7.7)
Neutrophils Relative %: 63.2 % (ref 43.0–77.0)
Platelets: 380 10*3/uL (ref 150.0–400.0)
RBC: 4.21 Mil/uL (ref 3.87–5.11)
RDW: 12.8 % (ref 11.5–15.5)
WBC: 7.1 10*3/uL (ref 4.0–10.5)

## 2014-06-27 LAB — LIPID PANEL
Cholesterol: 152 mg/dL (ref 0–200)
HDL: 44.9 mg/dL (ref >39.00)
LDL Cholesterol: 86 mg/dL (ref 0–99)
NONHDL: 107.1
Total CHOL/HDL Ratio: 3
Triglycerides: 108 mg/dL (ref 0.0–149.0)
VLDL: 21.6 mg/dL (ref 0.0–40.0)

## 2014-06-27 LAB — HEPATIC FUNCTION PANEL
ALBUMIN: 3.7 g/dL (ref 3.5–5.2)
ALK PHOS: 38 U/L — AB (ref 39–117)
ALT: 14 U/L (ref 0–35)
AST: 18 U/L (ref 0–37)
Bilirubin, Direct: 0 mg/dL (ref 0.0–0.3)
TOTAL PROTEIN: 7.9 g/dL (ref 6.0–8.3)
Total Bilirubin: 0.6 mg/dL (ref 0.2–1.2)

## 2014-06-27 LAB — BASIC METABOLIC PANEL
BUN: 12 mg/dL (ref 6–23)
CO2: 20 meq/L (ref 19–32)
Calcium: 9 mg/dL (ref 8.4–10.5)
Chloride: 105 mEq/L (ref 96–112)
Creatinine, Ser: 0.8 mg/dL (ref 0.4–1.2)
GFR: 98.74 mL/min (ref >60.00)
Glucose, Bld: 81 mg/dL (ref 70–99)
Potassium: 4.1 mEq/L (ref 3.5–5.1)
SODIUM: 137 meq/L (ref 135–145)

## 2014-06-27 LAB — TSH: TSH: 0.68 u[IU]/mL (ref 0.35–4.50)

## 2014-06-28 ENCOUNTER — Ambulatory Visit: Payer: 59 | Admitting: Family Medicine

## 2014-07-04 ENCOUNTER — Encounter: Payer: Self-pay | Admitting: Family Medicine

## 2014-07-04 ENCOUNTER — Ambulatory Visit (INDEPENDENT_AMBULATORY_CARE_PROVIDER_SITE_OTHER): Payer: 59 | Admitting: Family Medicine

## 2014-07-04 VITALS — BP 131/84 | HR 61 | Ht 65.0 in | Wt 155.0 lb

## 2014-07-04 DIAGNOSIS — M25561 Pain in right knee: Secondary | ICD-10-CM

## 2014-07-04 NOTE — Patient Instructions (Signed)
You have patellofemoral syndrome Avoid painful activities (especially squats and lunges, plyometrics, increasing running mileage) as much as possible. Straight leg raises, straight leg raises with foot turned outwards, side hip raises 3 sets of 10 once a day. Add ankle weight if these become too easy. Consider formal physical therapy Try Dr Zoe Lan active series, superfeet insoles or something similar. Icing 15 minutes at a time 3-4 times a day as needed. Tylenol or aleve as needed for pain Follow up with me in 6 weeks for reevaluation.

## 2014-07-05 ENCOUNTER — Encounter: Payer: Self-pay | Admitting: Family Medicine

## 2014-07-05 DIAGNOSIS — M25561 Pain in right knee: Secondary | ICD-10-CM | POA: Insufficient documentation

## 2014-07-05 NOTE — Progress Notes (Signed)
PCP and referred by: Garnet Koyanagi, DO  Subjective:   HPI: Patient is a 28 y.o. female here for right knee pain.  Patient reports for about 10 years she's had anterior right knee pain. No known injury or trauma. No swelling or bruising. Feels like it is going to pop out of place at times. Sometimes left knee bothers her. 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 No catching or locking. Has not had any treatment for this to date.  Past Medical History  Diagnosis Date  . Chicken pox   . Frequent headaches   . Migraine     Current Outpatient Prescriptions on File Prior to Visit  Medication Sig Dispense Refill  . Multiple Vitamin (MULTIVITAMIN) tablet Take 1 tablet by mouth daily.     No current facility-administered medications on file prior to visit.    Past Surgical History  Procedure Laterality Date  . Wisdom tooth extraction      No Known Allergies  History   Social History  . Marital Status: Single    Spouse Name: N/A    Number of Children: N/A  . 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 Smoking status: Former Smoker    Types: Cigarettes    Quit date: 01/14/2014  . Smokeless tobacco: Never Used     Comment: 1-2 cig a week  . Alcohol Use: 0.0 oz/week    0 Not specified per week     Comment: Socially--1 every 2-3 weeks  . Drug Use: No     Comment: Marijuana occasionally--stopped  . Sexual Activity:    Partners: Male    Birth Control/ Protection: Condom   Other Topics Concern  . Not on file   Social History Narrative   Exercise-- no    Family History  Problem Relation Age of Onset  . Alcohol abuse Brother   . Alcohol abuse Father   . Sudden death Father 22    Unknown cause of death  . Alcohol abuse Paternal Uncle   . Alcohol abuse Paternal Grandmother   . Sudden death Paternal Uncle     6 uncles died before they were 2     BP 131/84  mmHg  Pulse 61  Ht 5\' 5"  (1.651 m)  Wt 155 lb (70.308 kg)  BMI 25.79 kg/m2  LMP 05/26/2014  Review of Systems: See HPI above.    Objective:  Physical Exam:  Gen: NAD  Right knee: No gross deformity, ecchymoses, swelling.  No VMO atrophy. No TTP currently. FROM. Negative ant/post drawers. Negative valgus/varus testing. Negative lachmanns. Negative mcmurrays, apleys, patellar apprehension, clarkes. NV intact distally. Hip abduction 4/5 strength. Moderate overpronation.    Assessment & Plan:  1. Right knee pain - exam reassuring.  2/2 patellofemoral syndrome.  Start home exercise program which was reviewed today.  Better arch support reviewed.  Icing, tylenol/motrin if needed.  Consider physical therapy if not improving.  F/u in 6 weeks.

## 2014-07-05 NOTE — Assessment & Plan Note (Signed)
exam reassuring.  2/2 patellofemoral syndrome.  Start home exercise program which was reviewed today.  Better arch support reviewed.  Icing, tylenol/motrin if needed.  Consider physical therapy if not improving.  F/u in 6 weeks.

## 2014-07-16 ENCOUNTER — Encounter: Payer: Self-pay | Admitting: *Deleted

## 2014-08-22 ENCOUNTER — Ambulatory Visit: Payer: 59 | Admitting: Family Medicine

## 2014-12-24 ENCOUNTER — Ambulatory Visit (INDEPENDENT_AMBULATORY_CARE_PROVIDER_SITE_OTHER): Payer: 59 | Admitting: Family Medicine

## 2014-12-24 ENCOUNTER — Encounter: Payer: Self-pay | Admitting: Family Medicine

## 2014-12-24 VITALS — BP 102/60 | HR 62 | Temp 98.6°F | Wt 159.2 lb

## 2014-12-24 DIAGNOSIS — Z3009 Encounter for other general counseling and advice on contraception: Secondary | ICD-10-CM | POA: Diagnosis not present

## 2014-12-24 LAB — POCT URINE PREGNANCY: PREG TEST UR: NEGATIVE

## 2014-12-24 MED ORDER — NORGESTIM-ETH ESTRAD TRIPHASIC 0.18/0.215/0.25 MG-25 MCG PO TABS: 1.0000 | ORAL_TABLET | Freq: Every day | ORAL | Status: DC

## 2014-12-24 NOTE — Progress Notes (Signed)
Pre visit review using our clinic review tool, if applicable. No additional management support is needed unless otherwise documented below in the visit note. 

## 2014-12-24 NOTE — Patient Instructions (Signed)
Contraception Choices Contraception (birth control) is the use of any methods or devices to prevent pregnancy. Below are some methods to help avoid pregnancy. HORMONAL METHODS   Contraceptive implant. This is a thin, plastic tube containing progesterone hormone. It does not contain estrogen hormone. Your health care provider inserts the tube in the inner part of the upper arm. The tube can remain in place for up to 3 years. After 3 years, the implant must be removed. The implant prevents the ovaries from releasing an egg (ovulation), thickens the cervical mucus to prevent sperm from entering the uterus, and thins the lining of the inside of the uterus.  Progesterone-only injections. These injections are given every 3 months by your health care provider to prevent pregnancy. This synthetic progesterone hormone stops the ovaries from releasing eggs. It also thickens cervical mucus and changes the uterine lining. This makes it harder for sperm to survive in the uterus.  Birth control pills. These pills contain estrogen and progesterone hormone. They work by preventing the ovaries from releasing eggs (ovulation). They also cause the cervical mucus to thicken, preventing the sperm from entering the uterus. Birth control pills are prescribed by a health care provider.Birth control pills can also be used to treat heavy periods.  Minipill. This type of birth control pill contains only the progesterone hormone. They are taken every day of each month and must be prescribed by your health care provider.  Birth control patch. The patch contains hormones similar to those in birth control pills. It must be changed once a week and is prescribed by a health care provider.  Vaginal ring. The ring contains hormones similar to those in birth control pills. It is left in the vagina for 3 weeks, removed for 1 week, and then a new one is put back in place. The patient must be comfortable inserting and removing the ring  from the vagina.A health care provider's prescription is necessary.  Emergency contraception. Emergency contraceptives prevent pregnancy after unprotected sexual intercourse. This pill can be taken right after sex or up to 5 days after unprotected sex. It is most effective the sooner you take the pills after having sexual intercourse. Most emergency contraceptive pills are available without a prescription. Check with your pharmacist. Do not use emergency contraception as your only form of birth control. BARRIER METHODS   Female condom. This is a thin sheath (latex or rubber) that is worn over the penis during sexual intercourse. It can be used with spermicide to increase effectiveness.  Female condom. This is a soft, loose-fitting sheath that is put into the vagina before sexual intercourse.  Diaphragm. This is a soft, latex, dome-shaped barrier that must be fitted by a health care provider. It is inserted into the vagina, along with a spermicidal jelly. It is inserted before intercourse. The diaphragm should be left in the vagina for 6 to 8 hours after intercourse.  Cervical cap. This is a round, soft, latex or plastic cup that fits over the cervix and must be fitted by a health care provider. The cap can be left in place for up to 48 hours after intercourse.  Sponge. This is a soft, circular piece of polyurethane foam. The sponge has spermicide in it. It is inserted into the vagina after wetting it and before sexual intercourse.  Spermicides. These are chemicals that kill or block sperm from entering the cervix and uterus. They come in the form of creams, jellies, suppositories, foam, or tablets. They do not require a   prescription. They are inserted into the vagina with an applicator before having sexual intercourse. The process must be repeated every time you have sexual intercourse. INTRAUTERINE CONTRACEPTION  Intrauterine device (IUD). This is a T-shaped device that is put in a woman's uterus  during a menstrual period to prevent pregnancy. There are 2 types:  Copper IUD. This type of IUD is wrapped in copper wire and is placed inside the uterus. Copper makes the uterus and fallopian tubes produce a fluid that kills sperm. It can stay in place for 10 years.  Hormone IUD. This type of IUD contains the hormone progestin (synthetic progesterone). The hormone thickens the cervical mucus and prevents sperm from entering the uterus, and it also thins the uterine lining to prevent implantation of a fertilized egg. The hormone can weaken or kill the sperm that get into the uterus. It can stay in place for 3-5 years, depending on which type of IUD is used. PERMANENT METHODS OF CONTRACEPTION  Female tubal ligation. This is when the woman's fallopian tubes are surgically sealed, tied, or blocked to prevent the egg from traveling to the uterus.  Hysteroscopic sterilization. This involves placing a small coil or insert into each fallopian tube. Your doctor uses a technique called hysteroscopy to do the procedure. The device causes scar tissue to form. This results in permanent blockage of the fallopian tubes, so the sperm cannot fertilize the egg. It takes about 3 months after the procedure for the tubes to become blocked. You must use another form of birth control for these 3 months.  Female sterilization. This is when the female has the tubes that carry sperm tied off (vasectomy).This blocks sperm from entering the vagina during sexual intercourse. After the procedure, the man can still ejaculate fluid (semen). NATURAL PLANNING METHODS  Natural family planning. This is not having sexual intercourse or using a barrier method (condom, diaphragm, cervical cap) on days the woman could become pregnant.  Calendar method. This is keeping track of the length of each menstrual cycle and identifying when you are fertile.  Ovulation method. This is avoiding sexual intercourse during ovulation.  Symptothermal  method. This is avoiding sexual intercourse during ovulation, using a thermometer and ovulation symptoms.  Post-ovulation method. This is timing sexual intercourse after you have ovulated. Regardless of which type or method of contraception you choose, it is important that you use condoms to protect against the transmission of sexually transmitted infections (STIs). Talk with your health care provider about which form of contraception is most appropriate for you. Document Released: 08/09/2005 Document Revised: 08/14/2013 Document Reviewed: 02/01/2013 [employer] Patient Information 2015 [employer]. This information is not intended to replace advice given to you by your health care provider. Make sure you discuss any questions you have with your health care provider.  

## 2014-12-24 NOTE — Progress Notes (Signed)
   Subjective:    Patient ID: Marin Roberts, female    DOB: [birth year], 29 y.o.   MRN: [id number]  HPI  Patient here to discuss going back on bcp.  No complaints.  Past Medical History  Diagnosis Date  . Chicken pox   . Frequent headaches   . Migraine     Review of Systems  Constitutional: Positive for fatigue. Negative for activity change, appetite change and unexpected weight change.  Respiratory: Negative for cough and shortness of breath.   Cardiovascular: Negative for chest pain and palpitations.  Psychiatric/Behavioral: Negative for behavioral problems and dysphoric mood. The patient is not nervous/anxious.     Current Outpatient Prescriptions on File Prior to Visit  Medication Sig Dispense Refill  . Multiple Vitamin (MULTIVITAMIN) tablet Take 1 tablet by mouth daily.     No current facility-administered medications on file prior to visit.       Objective:    Physical Exam  Constitutional: She is oriented to person, place, and time. She appears well-developed and well-nourished. No distress.  Neurological: She is alert and oriented to person, place, and time.  Psychiatric: She has a normal mood and affect. Her behavior is normal. Judgment and thought content normal.    BP 102/60 mmHg  Pulse 62  Temp(Src) 98.6 F (37 C) (Oral)  Wt 159 lb 3.2 oz (72.213 kg)  SpO2 97%  LMP 11/22/2014 Wt Readings from Last 3 Encounters:  12/24/14 159 lb 3.2 oz (72.213 kg)  07/04/14 155 lb (70.308 kg)  06/24/14 160 lb 6.4 oz (72.757 kg)     Lab Results  Component Value Date   WBC 7.1 06/27/2014   HGB 12.8 06/27/2014   HCT 38.4 06/27/2014   PLT 380.0 06/27/2014   GLUCOSE 81 06/27/2014   CHOL 152 06/27/2014   TRIG 108.0 06/27/2014   HDL 44.90 06/27/2014   LDLCALC 86 06/27/2014   ALT 14 06/27/2014   AST 18 06/27/2014   NA 137 06/27/2014   K 4.1 06/27/2014   CL 105 06/27/2014   CREATININE 0.8 06/27/2014   BUN 12 06/27/2014   CO2 20 06/27/2014   TSH 0.68 06/27/2014       Assessment & Plan:   Problem List Items Addressed This Visit    None    Visit Diagnoses    Encounter for other general counseling or advice on contraception    -  Primary    Relevant Medications    Norgestimate-Ethinyl Estradiol Triphasic (ORTHO TRI-CYCLEN LO) 0.18/0.215/0.25 MG-25 MCG tab    Other Relevant Orders    POCT urine pregnancy (Completed)       I am having Ms. Mckethan start on Norgestimate-Ethinyl Estradiol Triphasic. I am also having her maintain her multivitamin.  Meds ordered this encounter  Medications  . Norgestimate-Ethinyl Estradiol Triphasic (ORTHO TRI-CYCLEN LO) 0.18/0.215/0.25 MG-25 MCG tab    Sig: Take 1 tablet by mouth daily.    Dispense:  1 Package    Refill:  Peak, DO

## 2015-01-02 ENCOUNTER — Ambulatory Visit (INDEPENDENT_AMBULATORY_CARE_PROVIDER_SITE_OTHER): Payer: 59 | Admitting: Family Medicine

## 2015-01-02 ENCOUNTER — Encounter: Payer: Self-pay | Admitting: Family Medicine

## 2015-01-02 VITALS — BP 110/78 | HR 64 | Temp 98.7°F | Wt 159.4 lb

## 2015-01-02 DIAGNOSIS — Z789 Other specified health status: Secondary | ICD-10-CM | POA: Diagnosis not present

## 2015-01-02 DIAGNOSIS — Z111 Encounter for screening for respiratory tuberculosis: Secondary | ICD-10-CM | POA: Diagnosis not present

## 2015-01-02 DIAGNOSIS — Z9229 Personal history of other drug therapy: Secondary | ICD-10-CM

## 2015-01-02 NOTE — Progress Notes (Signed)
Patient ID: Melissa Robles, female    DOB: 11-20-1985  Age: 29 y.o. 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  See forms  Review of Systems  Constitutional: Negative for diaphoresis, appetite change, fatigue and unexpected weight change.  Eyes: Negative for pain, redness and visual disturbance.  Respiratory: Negative for cough, chest tightness, shortness of breath and wheezing.   Cardiovascular: Negative for chest pain, palpitations and leg swelling.  Endocrine: Negative for cold intolerance, heat intolerance, polydipsia, polyphagia and polyuria.  Genitourinary: Negative for dysuria, frequency and difficulty urinating.  Neurological: Negative for dizziness, light-headedness, numbness and headaches.    History Past Medical History  Diagnosis Date  . Chicken pox   . Frequent headaches   . Migraine     She has past surgical history that includes Wisdom tooth extraction.   Her family history includes Alcohol abuse in her brother, father, paternal grandmother, and paternal uncle; Sudden death in her paternal uncle; Sudden death (age of onset: 68) in her father.She reports that she quit smoking about a year ago. Her smoking use included Cigarettes. She has never used smokeless tobacco. She reports that she drinks alcohol. She reports that she does not use illicit drugs.  Current Outpatient Prescriptions on File Prior to Visit  Medication Sig Dispense Refill  . Multiple Vitamin (MULTIVITAMIN) tablet Take 1 tablet by mouth daily.    . Norgestimate-Ethinyl Estradiol Triphasic (ORTHO TRI-CYCLEN LO) 0.18/0.215/0.25 MG-25 MCG tab Take 1 tablet by mouth daily. 1 Package 11   No current facility-administered medications on file prior to visit.     Objective:  Objective Physical Exam  Constitutional: She is oriented to person, place, and time. She appears well-developed and well-nourished.  HENT:  Head: Normocephalic and  atraumatic.  Neck: Normal range of motion. Neck supple. Carotid bruit is not present.  Neurological: She is alert and oriented to person, place, and time.  Psychiatric: She has a normal mood and affect. Her behavior is normal.   BP 110/78 mmHg  Pulse 64  Temp(Src) 98.7 F (37.1 C) (Oral)  Wt 159 lb 6.4 oz (72.303 kg)  SpO2 98%  LMP 11/22/2014 Wt Readings from Last 3 Encounters:  01/02/15 159 lb 6.4 oz (72.303 kg)  12/24/14 159 lb 3.2 oz (72.213 kg)  07/04/14 155 lb (70.308 kg)     Lab Results  Component Value Date   WBC 7.1 06/27/2014   HGB 12.8 06/27/2014   HCT 38.4 06/27/2014   PLT 380.0 06/27/2014   GLUCOSE 81 06/27/2014   CHOL 152 06/27/2014   TRIG 108.0 06/27/2014   HDL 44.90 06/27/2014   LDLCALC 86 06/27/2014   ALT 14 06/27/2014   AST 18 06/27/2014   NA 137 06/27/2014   K 4.1 06/27/2014   CL 105 06/27/2014   CREATININE 0.8 06/27/2014   BUN 12 06/27/2014   CO2 20 06/27/2014   TSH 0.68 06/27/2014    No results found.   Assessment & Plan:  Plan I am having Ms. Dobrowski maintain her multivitamin and Norgestimate-Ethinyl Estradiol Triphasic.  No orders of the defined types were placed in this encounter.    Problem List Items Addressed This Visit    None    Visit Diagnoses    Hepatitis B vaccination status unknown    -  Primary    Relevant Orders    Hepatitis B surface antibody    Immune to varicella        Relevant Orders  Varicella zoster antibody, IgG    Visit for TB skin test        Relevant Orders    Quantiferon tb gold assay    History of MMR vaccination        Relevant Orders    Measles/Mumps/Rubella Immunity       Follow-up: Return if symptoms worsen or fail to improve.  Garnet Koyanagi, DO

## 2015-01-02 NOTE — Patient Instructions (Signed)
Immunization Schedule, Adult  Influenza vaccine.  All adults should be immunized every year.  All adults, including pregnant women and people with hives-only allergy to eggs can receive the inactivated influenza (IIV) vaccine.  Adults aged 27-49 years can receive the recombinant influenza (RIV) vaccine. The RIV vaccine does not contain any egg protein.  Adults aged 26 years or older can receive the standard-dose IIV or the high-dose IIV.  Tetanus, diphtheria, and acellular pertussis (Td, Tdap) vaccine.  Pregnant women should receive 1 dose of Tdap vaccine during each pregnancy. The dose should be obtained regardless of the length of time since the last dose. Immunization is preferred during the 27th to 36th week of gestation.  An adult who has not previously received Tdap or who does not know his or her vaccine status should receive 1 dose of Tdap. This initial dose should be followed by tetanus and diphtheria toxoids (Td) booster doses every 10 years.  Adults with an unknown or incomplete history of completing a 3-dose immunization series with Td-containing vaccines should begin or complete a primary immunization series including a Tdap dose.  Adults should receive a Td booster every 10 years.  Varicella vaccine.  An adult without evidence of immunity to varicella should receive 2 doses or a second dose if he or she has previously received 1 dose.  Pregnant females who do not have evidence of immunity should receive the first dose after pregnancy. This first dose should be obtained before leaving the health care facility. The second dose should be obtained 4-8 weeks after the first dose.  Human papillomavirus (HPV) vaccine.  Females aged 13-26 years who have not received the vaccine previously should obtain the 3-dose series.  The vaccine is not recommended for use in pregnant females. However, pregnancy testing is not needed before receiving a dose. If a female is found to be  pregnant after receiving a dose, no treatment is needed. In that case, the remaining doses should be delayed until after the pregnancy.  Males aged 1-21 years who have not received the vaccine previously should receive the 3-dose series. Males aged 22-26 years may be immunized.  Immunization is recommended through the age of 64 years for any female who has sex with males and did not get any or all doses earlier.  Immunization is recommended for any person with an immunocompromised condition through the age of 50 years if he or she did not get any or all doses earlier.  During the 3-dose series, the second dose should be obtained 4-8 weeks after the first dose. The third dose should be obtained 24 weeks after the first dose and 16 weeks after the second dose.  Zoster vaccine.  One dose is recommended for adults aged 46 years or older unless certain conditions are present.  Measles, mumps, and rubella (MMR) vaccine.  Adults born before [birth year] generally are considered immune to measles and mumps.  Adults born in [birth year] or later should have 1 or more doses of MMR vaccine unless there is a contraindication to the vaccine or there is laboratory evidence of immunity to each of the three diseases.  A routine second dose of MMR vaccine should be obtained at least 28 days after the first dose for students attending postsecondary schools, health care workers, or international travelers.  People who received inactivated measles vaccine or an unknown type of measles vaccine during 1963-1967 should receive 2 doses of MMR vaccine.  People who received inactivated mumps vaccine or an unknown type  of mumps vaccine before 1979 and are at high risk for mumps infection should consider immunization with 2 doses of MMR vaccine.  For females of childbearing age, rubella immunity should be determined. If there is no evidence of immunity, females who are not pregnant should be vaccinated. If there is no evidence of  immunity, females who are pregnant should delay immunization until after pregnancy.  Unvaccinated health care workers born before 1957 who lack laboratory evidence of measles, mumps, or rubella immunity or laboratory confirmation of disease should consider measles and mumps immunization with 2 doses of MMR vaccine or rubella immunization with 1 dose of MMR vaccine.  Pneumococcal 13-valent conjugate (PCV13) vaccine.  When indicated, a person who is uncertain of his or her immunization history and has no record of immunization should receive the PCV13 vaccine.  An adult aged 19 years or older who has certain medical conditions and has not been previously immunized should receive 1 dose of PCV13 vaccine. This PCV13 should be followed with a dose of pneumococcal polysaccharide (PPSV23) vaccine. The PPSV23 vaccine dose should be obtained at least 8 weeks after the dose of PCV13 vaccine.  An adult aged 19 years or older who has certain medical conditions and previously received 1 or more doses of PPSV23 vaccine should receive 1 dose of PCV13. The PCV13 vaccine dose should be obtained 1 or more years after the last PPSV23 vaccine dose.  Pneumococcal polysaccharide (PPSV23) vaccine.  When PCV13 is also indicated, PCV13 should be obtained first.  All adults aged 65 years and older should be immunized.  An adult younger than age 65 years who has certain medical conditions should be immunized.  Any person who resides in a nursing home or long-term care facility should be immunized.  An adult smoker should be immunized.  People with an immunocompromised condition and certain other conditions should receive both PCV13 and PPSV23 vaccines.  People with human immunodeficiency virus (HIV) infection should be immunized as soon as possible after diagnosis.  Immunization during chemotherapy or radiation therapy should be avoided.  Routine use of PPSV23 vaccine is not recommended for American Indians,  [place] Natives, or people younger than 65 years unless there are medical conditions that require PPSV23 vaccine.  When indicated, people who have unknown immunization and have no record of immunization should receive PPSV23 vaccine.  One-time revaccination 5 years after the first dose of PPSV23 is recommended for people aged 19-64 years who have chronic kidney failure, nephrotic syndrome, asplenia, or immunocompromised conditions.  People who received 1-2 doses of PPSV23 before age 65 years should receive another dose of PPSV23 vaccine at age 65 years or later if at least 5 years have passed since the previous dose.  Doses of PPSV23 are not needed for people immunized with PPSV23 at or after age 65 years.  Meningococcal vaccine.  Adults with asplenia or persistent complement component deficiencies should receive 2 doses of quadrivalent meningococcal conjugate (MenACWY-D) vaccine. The doses should be obtained at least 2 months apart.  Microbiologists working with certain meningococcal bacteria, military recruits, people at risk during an outbreak, and people who travel to or live in countries with a high rate of meningitis should be immunized.  A first-year college student up through age 21 years who is living in a residence hall should receive a dose if he or she did not receive a dose on or after his or her 16th birthday.  Adults who have certain high-risk conditions should receive one or more doses   of vaccine.  Hepatitis A vaccine.  Adults who wish to be protected from this disease, have certain high-risk conditions, work with hepatitis A-infected animals, work in hepatitis A research labs, or travel to or work in countries with a high rate of hepatitis A should be immunized.  Adults who were previously unvaccinated and who anticipate close contact with an international adoptee during the first 60 days after arrival in the [place] from a country with a high rate of hepatitis A should  be immunized.  Hepatitis B vaccine.  Adults who wish to be protected from this disease, have certain high-risk conditions, may be exposed to blood or other infectious body fluids, are household contacts or sex partners of hepatitis B positive people, are clients or workers in certain care facilities, or travel to or work in countries with a high rate of hepatitis B should be immunized.  Haemophilus influenzae type b (Hib) vaccine.  A previously unvaccinated person with asplenia or sickle cell disease or having a scheduled splenectomy should receive 1 dose of Hib vaccine.  Regardless of previous immunization, a recipient of a hematopoietic stem cell transplant should receive a 3-dose series 6-12 months after his or her successful transplant.  Hib vaccine is not recommended for adults with HIV infection. Document Released: 10/30/2003 Document Revised: 12/04/2012 Document Reviewed: 09/26/2012 Exit[employer] Patient Information 2015 [employer]. This information is not intended to replace advice given to you by your health care provider. Make sure you discuss any questions you have with your health care provider.  

## 2015-01-02 NOTE — Progress Notes (Signed)
Pre visit review using our clinic review tool, if applicable. No additional management support is needed unless otherwise documented below in the visit note. 

## 2015-01-03 LAB — HEPATITIS B SURFACE ANTIBODY,QUALITATIVE: HEP B S AB: POSITIVE — AB

## 2015-01-03 LAB — MEASLES/MUMPS/RUBELLA IMMUNITY
Mumps IgG: 96.7 AU/mL — ABNORMAL HIGH (ref <9.00)
RUBELLA: 1.87 {index} — AB (ref <0.90)
RUBEOLA IGG: 67.1 [AU]/ml — AB (ref <25.00)

## 2015-01-03 LAB — VARICELLA ZOSTER ANTIBODY, IGG: Varicella IgG: 308 Index — ABNORMAL HIGH (ref <135.00)

## 2015-01-04 LAB — QUANTIFERON TB GOLD ASSAY (BLOOD)
INTERFERON GAMMA RELEASE ASSAY: NEGATIVE
MITOGEN VALUE: 3.3 [IU]/mL
Quantiferon Nil Value: 0.08 IU/mL
Quantiferon Tb Ag Minus Nil Value: 0.03 IU/mL
TB Ag value: 0.11 IU/mL

## 2015-03-12 ENCOUNTER — Encounter: Payer: Self-pay | Admitting: Family Medicine

## 2015-03-13 ENCOUNTER — Other Ambulatory Visit: Payer: Self-pay | Admitting: Family Medicine

## 2015-03-13 DIAGNOSIS — Z3040 Encounter for surveillance of contraceptives, unspecified: Secondary | ICD-10-CM

## 2015-03-13 MED ORDER — DROSPIRENONE-ETHINYL ESTRADIOL 3-0.02 MG PO TABS: 1.0000 | ORAL_TABLET | Freq: Every day | ORAL | Status: DC

## 2015-06-25 ENCOUNTER — Telehealth: Payer: Self-pay | Admitting: Behavioral Health

## 2015-06-25 NOTE — Telephone Encounter (Signed)
Patient cancelled the appointment for 06/26/15 and will reschedule for a later date.

## 2015-06-26 ENCOUNTER — Encounter: Payer: 59 | Admitting: Family Medicine

## 2015-06-26 ENCOUNTER — Encounter: Payer: Self-pay | Admitting: Family Medicine

## 2016-02-23 ENCOUNTER — Encounter: Payer: Self-pay | Admitting: Family Medicine

## 2016-03-09 ENCOUNTER — Encounter: Payer: Self-pay | Admitting: Family Medicine

## 2016-06-22 ENCOUNTER — Telehealth: Payer: Self-pay | Admitting: Family Medicine

## 2016-06-22 ENCOUNTER — Encounter: Payer: Self-pay | Admitting: Family Medicine

## 2016-06-22 NOTE — Telephone Encounter (Signed)
Sent message via My Chart and mailed letter informing patient that Dr. Carollee Herter will be out of the office August 09, 2016 and the appointment scheduled for that date needs to be rescheduled. Appointment cancelled

## 2016-08-05 ENCOUNTER — Encounter: Payer: Self-pay | Admitting: Family Medicine

## 2016-08-09 ENCOUNTER — Encounter: Payer: Self-pay | Admitting: Family Medicine

## 2016-08-17 ENCOUNTER — Encounter: Payer: Self-pay | Admitting: Family Medicine

## 2016-08-26 ENCOUNTER — Ambulatory Visit: Payer: Self-pay | Admitting: Family Medicine

## 2016-09-06 ENCOUNTER — Ambulatory Visit (INDEPENDENT_AMBULATORY_CARE_PROVIDER_SITE_OTHER): Payer: No Typology Code available for payment source | Admitting: Family Medicine

## 2016-09-06 ENCOUNTER — Encounter: Payer: Self-pay | Admitting: Family Medicine

## 2016-09-06 ENCOUNTER — Other Ambulatory Visit (HOSPITAL_COMMUNITY)
Admission: RE | Admit: 2016-09-06 | Discharge: 2016-09-06 | Disposition: A | Discharge status: Home / Self Care | Payer: No Typology Code available for payment source | Source: Ambulatory Visit | Attending: Family Medicine | Admitting: Family Medicine

## 2016-09-06 VITALS — BP 108/74 | HR 61 | Temp 97.6°F | Ht 64.0 in | Wt 165.8 lb

## 2016-09-06 DIAGNOSIS — Z1151 Encounter for screening for human papillomavirus (HPV): Secondary | ICD-10-CM | POA: Insufficient documentation

## 2016-09-06 DIAGNOSIS — Z0000 Encounter for general adult medical examination without abnormal findings: Secondary | ICD-10-CM

## 2016-09-06 DIAGNOSIS — Z3041 Encounter for surveillance of contraceptive pills: Secondary | ICD-10-CM

## 2016-09-06 DIAGNOSIS — Z01419 Encounter for gynecological examination (general) (routine) without abnormal findings: Secondary | ICD-10-CM | POA: Diagnosis present

## 2016-09-06 MED ORDER — NORGESTIMATE-ETH ESTRADIOL 0.25-35 MG-MCG PO TABS: 1.0000 | ORAL_TABLET | Freq: Every day | ORAL | 3 refills | Status: DC

## 2016-09-06 NOTE — Progress Notes (Signed)
Pre visit review using our clinic review tool, if applicable. No additional management support is needed unless otherwise documented below in the visit note. 

## 2016-09-06 NOTE — Progress Notes (Signed)
Subjective:     Melissa Robles is a 31 y.o. female and is here for a comprehensive physical exam. The patient reports no problems.  Social History   Social History  . Marital status: Single    Spouse name: N/A  . Number of children: N/A  . Years of education: N/A   Occupational History  . Health visitor  . student [employer]  . pharmacy tech [employer]    Social History Main Topics  . Smoking status: Former Smoker    Types: Cigarettes    Quit date: 01/14/2014  . Smokeless tobacco: Never Used     Comment: 1-2 cig a week  . Alcohol use No     Comment: Socially--1 every 2-3 weeks  . Drug use: No     Comment: Marijuana occasionally--stopped  . Sexual activity: Yes    Partners: Male    Birth control/ protection: Condom, Pill   Other Topics Concern  . Not on file   Social History Narrative   Exercise-- no   Health Maintenance  Topic Date Due  . HIV Screening  10/10/2002  . TETANUS/TDAP  03/21/2016  . PAP SMEAR  05/17/2016  . INFLUENZA VACCINE  Completed    The following portions of the patient's history were reviewed and updated as appropriate: She  has a past medical history of Chicken pox; Frequent headaches; and Migraine. She  does not have any pertinent problems on file. She  has a past surgical history that includes Wisdom tooth extraction. Her family history includes Alcohol abuse in her brother, father, paternal grandmother, and paternal uncle; Sudden death in her paternal uncle; Sudden death (age of onset: 100) in her father. She  reports that she quit smoking about 2 years ago. Her smoking use included Cigarettes. She has never used smokeless tobacco. She reports that she does not drink alcohol or use drugs. She has a current medication list which includes the following prescription(s): norgestimate-ethinyl estradiol and multivitamin. Current Outpatient Prescriptions on File Prior to Visit  Medication Sig Dispense Refill  . Multiple  Vitamin (MULTIVITAMIN) tablet Take 1 tablet by mouth daily.     No current facility-administered medications on file prior to visit.    She has No Known Allergies..  Review of Systems Review of Systems  Constitutional: Negative for activity change, appetite change and fatigue.  HENT: Negative for hearing loss, congestion, tinnitus and ear discharge.  dentist q15m Eyes: Negative for visual disturbance (see optho q1y -- vision corrected to 20/20 with glasses).  Respiratory: Negative for cough, chest tightness and shortness of breath.   Cardiovascular: Negative for chest pain, palpitations and leg swelling.  Gastrointestinal: Negative for abdominal pain, diarrhea, constipation and abdominal distention.  Genitourinary: Negative for urgency, frequency, decreased urine volume and difficulty urinating.  Musculoskeletal: Negative for back pain, arthralgias and gait problem.  Skin: Negative for color change, pallor and rash.  Neurological: Negative for dizziness, light-headedness, numbness and headaches.  Hematological: Negative for adenopathy. Does not bruise/bleed easily.  Psychiatric/Behavioral: Negative for suicidal ideas, confusion, sleep disturbance, self-injury, dysphoric mood, decreased concentration and agitation.       Objective:    BP 108/74 (BP Location: Left Arm, Patient Position: Sitting, Cuff Size: Normal)   Pulse 61   Temp 97.6 F (36.4 C) (Oral)   Ht 5\' 4"  (1.626 m)   Wt 165 lb 12.8 oz (75.2 kg)   LMP 07/04/2016   SpO2 97%   BMI 28.46 kg/m  General appearance: alert, cooperative, appears stated age and no distress Head: Normocephalic, without obvious abnormality, atraumatic Eyes: conjunctivae/corneas clear. PERRL, EOM's intact. Fundi benign. Ears: normal TM's and external ear canals both ears Nose: Nares normal. Septum midline. Mucosa normal. No drainage or sinus tenderness. Throat: lips, mucosa, and tongue normal; teeth and gums normal Neck: no adenopathy, no  carotid bruit, no JVD, supple, symmetrical, trachea midline and thyroid not enlarged, symmetric, no tenderness/mass/nodules Back: symmetric, no curvature. ROM normal. No CVA tenderness. Lungs: clear to auscultation bilaterally Breasts: normal appearance, no masses or tenderness Heart: regular rate and rhythm, S1, S2 normal, no murmur, click, rub or gallop Abdomen: soft, non-tender; bowel sounds normal; no masses,  no organomegaly Pelvic: cervix normal in appearance, external genitalia normal, no adnexal masses or tenderness, no cervical motion tenderness, rectovaginal septum normal, uterus normal size, shape, and consistency, vagina normal without discharge and papsmear Extremities: extremities normal, atraumatic, no cyanosis or edema Pulses: 2+ and symmetric Skin: Skin color, texture, turgor normal. No rashes or lesions Lymph nodes: Cervical, supraclavicular, and axillary nodes normal. Neurologic: Alert and oriented X 3, normal strength and tone. Normal symmetric reflexes. Normal coordination and gait    Assessment:    Healthy female exam.      Plan:    ghm utd Check labs See After Visit Summary for Counseling Recommendations

## 2016-09-06 NOTE — Patient Instructions (Signed)
Preventive Care 18-39 Years, Female Preventive care refers to lifestyle choices and visits with your health care provider that can promote health and wellness. What does preventive care include?  A yearly physical exam. This is also called an annual well check.  Dental exams once or twice a year.  Routine eye exams. Ask your health care provider how often you should have your eyes checked.  Personal lifestyle choices, including:  Daily care of your teeth and gums.  Regular physical activity.  Eating a healthy diet.  Avoiding tobacco and drug use.  Limiting alcohol use.  Practicing safe sex.  Taking vitamin and mineral supplements as recommended by your health care provider. What happens during an annual well check? The services and screenings done by your health care provider during your annual well check will depend on your age, overall health, lifestyle risk factors, and family history of disease. Counseling  Your health care provider may ask you questions about your:  Alcohol use.  Tobacco use.  Drug use.  Emotional well-being.  Home and relationship well-being.  Sexual activity.  Eating habits.  Work and work environment.  Method of birth control.  Menstrual cycle.  Pregnancy history. Screening  You may have the following tests or measurements:  Height, weight, and BMI.  Diabetes screening. This is done by checking your blood sugar (glucose) after you have not eaten for a while (fasting).  Blood pressure.  Lipid and cholesterol levels. These may be checked every 5 years starting at age 20.  Skin check.  Hepatitis C blood test.  Hepatitis B blood test.  Sexually transmitted disease (STD) testing.  BRCA-related cancer screening. This may be done if you have a family history of breast, ovarian, tubal, or peritoneal cancers.  Pelvic exam and Pap test. This may be done every 3 years starting at age 21. Starting at age 29, this may be done every 5  years if you have a Pap test in combination with an HPV test. Discuss your test results, treatment options, and if necessary, the need for more tests with your health care provider. Vaccines  Your health care provider may recommend certain vaccines, such as:  Influenza vaccine. This is recommended every year.  Tetanus, diphtheria, and acellular pertussis (Tdap, Td) vaccine. You may need a Td booster every 10 years.  Varicella vaccine. You may need this if you have not been vaccinated.  HPV vaccine. If you are 26 or younger, you may need three doses over 6 months.  Measles, mumps, and rubella (MMR) vaccine. You may need at least one dose of MMR. You may also need a second dose.  Pneumococcal 13-valent conjugate (PCV13) vaccine. You may need this if you have certain conditions and were not previously vaccinated.  Pneumococcal polysaccharide (PPSV23) vaccine. You may need one or two doses if you smoke cigarettes or if you have certain conditions.  Meningococcal vaccine. One dose is recommended if you are age 19-21 years and a first-year college student living in a residence hall, or if you have one of several medical conditions. You may also need additional booster doses.  Hepatitis A vaccine. You may need this if you have certain conditions or if you travel or work in places where you may be exposed to hepatitis A.  Hepatitis B vaccine. You may need this if you have certain conditions or if you travel or work in places where you may be exposed to hepatitis B.  Haemophilus influenzae type b (Hib) vaccine. You may need this   if you have certain risk factors. Talk to your health care provider about which screenings and vaccines you need and how often you need them. This information is not intended to replace advice given to you by your health care provider. Make sure you discuss any questions you have with your health care provider. Document Released: 10/05/2001 Document Revised: 04/28/2016  Document Reviewed: 06/10/2015 Elsevier Interactive Patient Education  2017 [employer].

## 2016-09-09 LAB — CYTOLOGY - PAP
Diagnosis: NEGATIVE
HPV: NOT DETECTED

## 2016-09-10 ENCOUNTER — Encounter: Payer: Self-pay | Admitting: Family Medicine

## 2017-04-14 ENCOUNTER — Encounter: Payer: Self-pay | Admitting: Family Medicine

## 2017-11-04 ENCOUNTER — Ambulatory Visit: Payer: No Typology Code available for payment source | Admitting: Family Medicine

## 2017-11-07 ENCOUNTER — Encounter: Payer: Self-pay | Admitting: Family Medicine

## 2018-01-17 ENCOUNTER — Encounter: Payer: Self-pay | Admitting: Family Medicine

## 2018-01-24 ENCOUNTER — Encounter: Payer: Self-pay | Admitting: Family Medicine

## 2018-02-03 ENCOUNTER — Encounter: Payer: Self-pay | Admitting: Family Medicine

## 2018-02-03 ENCOUNTER — Ambulatory Visit (INDEPENDENT_AMBULATORY_CARE_PROVIDER_SITE_OTHER): Payer: No Typology Code available for payment source | Admitting: Family Medicine

## 2018-02-03 VITALS — BP 115/81 | HR 60 | Temp 98.1°F | Resp 18 | Ht 65.0 in | Wt 158.0 lb

## 2018-02-03 DIAGNOSIS — R109 Unspecified abdominal pain: Secondary | ICD-10-CM

## 2018-02-03 LAB — COMPREHENSIVE METABOLIC PANEL
ALBUMIN: 4.6 g/dL (ref 3.5–5.2)
ALT: 11 U/L (ref 0–35)
AST: 13 U/L (ref 0–37)
Alkaline Phosphatase: 43 U/L (ref 39–117)
BUN: 8 mg/dL (ref 6–23)
CHLORIDE: 103 meq/L (ref 96–112)
CO2: 30 mEq/L (ref 19–32)
CREATININE: 0.68 mg/dL (ref 0.40–1.20)
Calcium: 10 mg/dL (ref 8.4–10.5)
GFR: 107.75 mL/min (ref >60.00)
GLUCOSE: 88 mg/dL (ref 70–99)
Potassium: 5.4 mEq/L — ABNORMAL HIGH (ref 3.5–5.1)
SODIUM: 139 meq/L (ref 135–145)
Total Bilirubin: 0.4 mg/dL (ref 0.2–1.2)
Total Protein: 7.9 g/dL (ref 6.0–8.3)

## 2018-02-03 LAB — CBC WITH DIFFERENTIAL/PLATELET
BASOS PCT: 1.2 % (ref 0.0–3.0)
Basophils Absolute: 0.1 10*3/uL (ref 0.0–0.1)
Eosinophils Absolute: 0.3 10*3/uL (ref 0.0–0.7)
Eosinophils Relative: 4.5 % (ref 0.0–5.0)
HCT: 40.6 % (ref 36.0–46.0)
Hemoglobin: 13.7 g/dL (ref 12.0–15.0)
LYMPHS PCT: 33.6 % (ref 12.0–46.0)
Lymphs Abs: 2.4 10*3/uL (ref 0.7–4.0)
MCHC: 33.7 g/dL (ref 30.0–36.0)
MCV: 92 fl (ref 78.0–100.0)
Monocytes Absolute: 0.5 10*3/uL (ref 0.1–1.0)
Monocytes Relative: 6.5 % (ref 3.0–12.0)
NEUTROS ABS: 4 10*3/uL (ref 1.4–7.7)
Neutrophils Relative %: 54.2 % (ref 43.0–77.0)
Platelets: 376 10*3/uL (ref 150.0–400.0)
RBC: 4.41 Mil/uL (ref 3.87–5.11)
RDW: 12.6 % (ref 11.5–15.5)
WBC: 7.3 10*3/uL (ref 4.0–10.5)

## 2018-02-03 LAB — POC URINALSYSI DIPSTICK (AUTOMATED)
BILIRUBIN UA: NEGATIVE
Glucose, UA: NEGATIVE
KETONES UA: NEGATIVE
Leukocytes, UA: NEGATIVE
Nitrite, UA: NEGATIVE
PROTEIN UA: NEGATIVE
Urobilinogen, UA: 0.2 E.U./dL
pH, UA: 6 (ref 5.0–8.0)

## 2018-02-03 LAB — POCT URINE PREGNANCY: Preg Test, Ur: NEGATIVE

## 2018-02-03 NOTE — Progress Notes (Signed)
Subjective:  I acted as a Education administrator for [employer]. Yancey Flemings, [place]   Patient ID: Melissa Robles, female    DOB: Nov 13, 1985, 32 y.o.   MRN: [id number]  Chief Complaint  Patient presents with  . Flank Pain    last July    HPI  Patient is in today for flank pain.  Its been going on for almost a year .  It comes and goes and lately she noticed it comes when she drinks alcohol.   It will not hurt right after but the next day 8/10 pain.  It lasts about 15 min and comes and goes over an hour or so.   No NVD, no chest pain, no sob, no fever.    Patient Care Team: Carollee Herter, Alferd Apa, DO as PCP - General (Family Medicine)   Past Medical History:  Diagnosis Date  . Chicken pox   . Frequent headaches   . Migraine     Past Surgical History:  Procedure Laterality Date  . WISDOM TOOTH EXTRACTION      Family History  Problem Relation Age of Onset  . Alcohol abuse Brother   . Alcohol abuse Father   . Sudden death Father 72       Unknown cause of death  . Alcohol abuse Paternal Uncle   . Alcohol abuse Paternal Grandmother   . Sudden death Paternal Uncle        6 uncles died before they were 31     Social History   Socioeconomic History  . Marital status: Married    Spouse name: Not on file  . Number of children: Not on file  . Years of education: Not on file  . Highest education level: Not on file  Occupational History  . 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 Occupation: Cytogeneticist  Social Needs  . Financial resource strain: Not on file  . Food insecurity:    Worry: Not on file    Inability: Not on file  . Transportation needs:    Medical: Not on file    Non-medical: Not on file  Tobacco Use  . Smoking status: Former Smoker    Types: Cigarettes    Last attempt to quit: 01/14/2014    Years since quitting: 4.0  . Smokeless tobacco: Never Used  . Tobacco  comment: 1-2 cig a week  Substance and Sexual Activity  . Alcohol use: No    Alcohol/week: 0.0 oz    Comment: Socially--1 every 2-3 weeks  . Drug use: No    Frequency: 1.0 times per week    Comment: Marijuana occasionally--stopped  . Sexual activity: Yes    Partners: Male    Birth control/protection: Condom, Pill  Lifestyle  . Physical activity:    Days per week: Not on file    Minutes per session: Not on file  . Stress: Not on file  Relationships  . Social connections:    Talks on phone: Not on file    Gets together: Not on file    Attends religious service: Not on file    Active member of club or organization: Not on file    Attends meetings of clubs or organizations: Not on file    Relationship status: Not on file  . Intimate partner violence:    Fear of current or ex partner: Not on file  Emotionally abused: Not on file    Physically abused: Not on file    Forced sexual activity: Not on file  Other Topics Concern  . Not on file  Social History Narrative   Exercise-- no    Outpatient Medications Prior to Visit  Medication Sig Dispense Refill  . Multiple Vitamin (MULTIVITAMIN) tablet Take 1 tablet by mouth daily.    . norgestimate-ethinyl estradiol (ORTHO-CYCLEN,SPRINTEC,PREVIFEM) 0.25-35 MG-MCG tablet Take 1 tablet by mouth daily. 3 Package 3   No facility-administered medications prior to visit.     No Known Allergies  Review of Systems  Constitutional: Negative for chills, fever and malaise/fatigue.  HENT: Negative for congestion and hearing loss.   Eyes: Negative for discharge.  Respiratory: Negative for cough, sputum production and shortness of breath.   Cardiovascular: Negative for chest pain, palpitations and leg swelling.  Gastrointestinal: Negative for abdominal pain, blood in stool, constipation, diarrhea, heartburn, nausea and vomiting.  Genitourinary: Negative for dysuria, frequency, hematuria and urgency.  Musculoskeletal: Positive for back pain.  Negative for falls and myalgias.  Skin: Negative for rash.  Neurological: Negative for dizziness, sensory change, loss of consciousness, weakness and headaches.  Endo/Heme/Allergies: Negative for environmental allergies. Does not bruise/bleed easily.  Psychiatric/Behavioral: Negative for depression and suicidal ideas. The patient is not nervous/anxious and does not have insomnia.        Objective:    Physical Exam  Constitutional: She is oriented to person, place, and time. She appears well-developed and well-nourished.  HENT:  Head: Normocephalic and atraumatic.  Eyes: Conjunctivae and EOM are normal.  Neck: Normal range of motion. Neck supple. No JVD present. Carotid bruit is not present. No thyromegaly present.  Cardiovascular: Normal rate, regular rhythm and normal heart sounds.  No murmur heard. Pulmonary/Chest: Effort normal and breath sounds normal. No respiratory distress. She has no wheezes. She has no rales. She exhibits no tenderness.  Musculoskeletal: She exhibits no edema.  Neurological: She is alert and oriented to person, place, and time.  Psychiatric: She has a normal mood and affect.  Nursing note and vitals reviewed.   BP 115/81 (BP Location: Left Arm, Patient Position: Sitting, Cuff Size: Normal)   Pulse 60   Temp 98.1 F (36.7 C) (Oral)   Resp 18   Ht _0  (1.651 m)   Wt 158 lb (71.7 kg)   SpO2 100%   BMI 26.29 kg/m  Wt Readings from Last 3 Encounters:  02/03/18 158 lb (71.7 kg)  09/06/16 165 lb 12.8 oz (75.2 kg)  01/02/15 159 lb 6.4 oz (72.3 kg)   BP Readings from Last 3 Encounters:  02/03/18 115/81  09/06/16 108/74  01/02/15 110/78     Immunization History  Administered Date(s) Administered  . DTaP 12/09/1987, 03/08/1988, 05/13/1988, 01/13/1989, 09/30/1992  . HPV Quadrivalent 03/21/2006, 05/23/2006, 08/31/2006, 09/28/2006, 10/05/2006, 03/01/2007  . Hepatitis A 02/16/2012  . Hepatitis B 04/17/1999, 05/22/1999, 10/23/1999  . HiB (PRP-OMP)  09/17/1994  . IPV 12/09/1987, 03/08/1988, 05/13/1988, 09/30/1992  . Influenza, Quadrivalent, Recombinant, Inj, Pf 04/02/2017  . Influenza, Seasonal, Injecte, Preservative Fre 05/31/2015  . Influenza,inj,quad, With Preservative 04/05/2016  . Influenza-Unspecified 04/19/2014, 07/10/2015  . MMR 01/13/1989, 09/17/1990  . Meningococcal Conjugate 02/18/2012  . Td 03/21/2006, 04/30/2016  . Tdap 03/21/2006    Health Maintenance  Topic Date Due  . HIV Screening  10/10/2002  . INFLUENZA VACCINE  03/23/2018  . PAP SMEAR  09/07/2019  . TETANUS/TDAP  04/30/2026    Lab Results  Component Value Date   WBC  7.1 06/27/2014   HGB 12.8 06/27/2014   HCT 38.4 06/27/2014   PLT 380.0 06/27/2014   GLUCOSE 81 06/27/2014   CHOL 152 06/27/2014   TRIG 108.0 06/27/2014   HDL 44.90 06/27/2014   LDLCALC 86 06/27/2014   ALT 14 06/27/2014   AST 18 06/27/2014   NA 137 06/27/2014   K 4.1 06/27/2014   CL 105 06/27/2014   CREATININE 0.8 06/27/2014   BUN 12 06/27/2014   CO2 20 06/27/2014   TSH 0.68 06/27/2014    Lab Results  Component Value Date   TSH 0.68 06/27/2014   Lab Results  Component Value Date   WBC 7.1 06/27/2014   HGB 12.8 06/27/2014   HCT 38.4 06/27/2014   MCV 91.0 06/27/2014   PLT 380.0 06/27/2014   Lab Results  Component Value Date   NA 137 06/27/2014   K 4.1 06/27/2014   CO2 20 06/27/2014   GLUCOSE 81 06/27/2014   BUN 12 06/27/2014   CREATININE 0.8 06/27/2014   BILITOT 0.6 06/27/2014   ALKPHOS 38 (L) 06/27/2014   AST 18 06/27/2014   ALT 14 06/27/2014   PROT 7.9 06/27/2014   ALBUMIN 3.7 06/27/2014   CALCIUM 9.0 06/27/2014   GFR 98.74 06/27/2014   Lab Results  Component Value Date   CHOL 152 06/27/2014   Lab Results  Component Value Date   HDL 44.90 06/27/2014   Lab Results  Component Value Date   LDLCALC 86 06/27/2014   Lab Results  Component Value Date   TRIG 108.0 06/27/2014   Lab Results  Component Value Date   CHOLHDL 3 06/27/2014   No results found  for: HGBA1C       Assessment & Plan:   Problem List Items Addressed This Visit    None    Visit Diagnoses    Right flank pain    -  Primary   Relevant Orders   CBC with Differential/Platelet   Comprehensive metabolic panel   POCT Urinalysis Dipstick (Automated) (Completed)   POCT urine pregnancy (Completed)   Abdominal pain, unspecified abdominal location       Relevant Orders   CT RENAL STONE STUDY    r flank pain with hematuria----check labs and ct rto or go to er if pain worsens Strain urine   I am having Melissa Robles maintain her multivitamin and norgestimate-ethinyl estradiol.  No orders of the defined types were placed in this encounter.   CMA served as Education administrator during this visit. History, Physical and Plan performed by medical provider. Documentation and orders reviewed and attested to.  Ann Held, DO

## 2018-02-03 NOTE — Patient Instructions (Signed)
Flank Pain, Adult Flank pain is pain that is located on the side of the body between the upper abdomen and the back. This area is called the flank. The pain may occur over a short period of time (acute), or it may be long-term or recurring (chronic). It may be mild or severe. Flank pain can be caused by many things, including:  Muscle soreness or injury.  Kidney stones or kidney disease.  Stress.  A disease of the spine (vertebral disk disease).  A lung infection (pneumonia).  Fluid around the lungs (pulmonary edema).  A skin rash caused by the chickenpox virus (shingles).  Tumors that affect the back of the abdomen.  Gallbladder disease.  Follow these instructions at home:  Drink enough fluid to keep your urine clear or pale yellow.  Rest as told by your health care provider.  Take over-the-counter and prescription medicines only as told by your health care provider.  Keep a journal to track what has caused your flank pain and what has made it feel better.  Keep all follow-up visits as told by your health care provider. This is important. Contact a health care provider if:  Your pain is not controlled with medicine.  You have new symptoms.  Your pain gets worse.  You have a fever.  Your symptoms last longer than 2-3 days.  You have trouble urinating or you are urinating very frequently. Get help right away if:  You have trouble breathing or you are short of breath.  Your abdomen hurts or it is swollen or red.  You have nausea or vomiting.  You feel faint or you pass out.  You have blood in your urine. Summary  Flank pain is pain that is located on the side of the body between the upper abdomen and the back.  The pain may occur over a short period of time (acute), or it may be long-term or recurring (chronic). It may be mild or severe.  Flank pain can be caused by many things.  Contact your health care provider if your symptoms get worse or they last  longer than 2-3 days. This information is not intended to replace advice given to you by your health care provider. Make sure you discuss any questions you have with your health care provider. Document Released: 09/30/2005 Document Revised: 10/22/2016 Document Reviewed: 10/22/2016 Elsevier Interactive Patient Education  2018 Elsevier Inc[employer]  

## 2018-02-07 ENCOUNTER — Ambulatory Visit (HOSPITAL_BASED_OUTPATIENT_CLINIC_OR_DEPARTMENT_OTHER)
Admission: RE | Admit: 2018-02-07 | Discharge: 2018-02-07 | Disposition: A | Discharge status: Home / Self Care | Payer: No Typology Code available for payment source | Source: Ambulatory Visit | Attending: Family Medicine | Admitting: Family Medicine

## 2018-02-07 DIAGNOSIS — N83201 Unspecified ovarian cyst, right side: Secondary | ICD-10-CM | POA: Insufficient documentation

## 2018-02-07 DIAGNOSIS — R109 Unspecified abdominal pain: Secondary | ICD-10-CM | POA: Insufficient documentation

## 2018-02-07 DIAGNOSIS — N83202 Unspecified ovarian cyst, left side: Secondary | ICD-10-CM | POA: Insufficient documentation

## 2018-04-27 ENCOUNTER — Encounter: Payer: No Typology Code available for payment source | Admitting: Family Medicine

## 2018-08-03 ENCOUNTER — Encounter: Payer: No Typology Code available for payment source | Admitting: Family Medicine

## 2019-01-13 IMAGING — CT CT RENAL STONE PROTOCOL
2 of 4 series · 16 of 46 positions shown, 18 images · non-contrast
Comparison: None.

CLINICAL DATA: Right flank pain and hematuria.

EXAM:
CT ABDOMEN AND PELVIS WITHOUT CONTRAST
TECHNIQUE: Multidetector CT imaging of the abdomen and pelvis was performed
following the standard protocol without IV contrast.

[Series 2: axial st · axial · 0.68mm/px · z∈[-584,-149]mm · 13 of 95 slices shown, 15 images]
[im 4/95  soft-tissue]
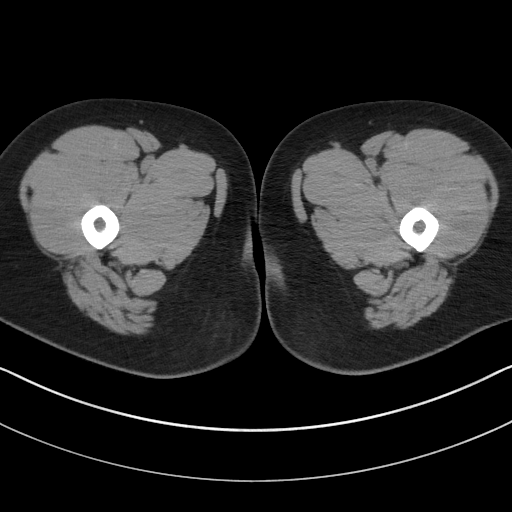
[im 4/95  bone]
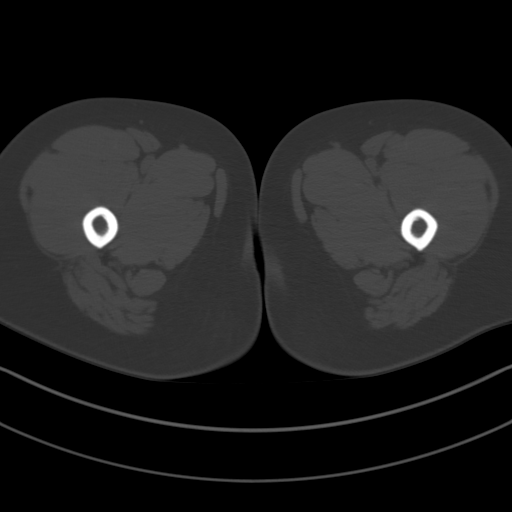
[im 12/95  soft-tissue]
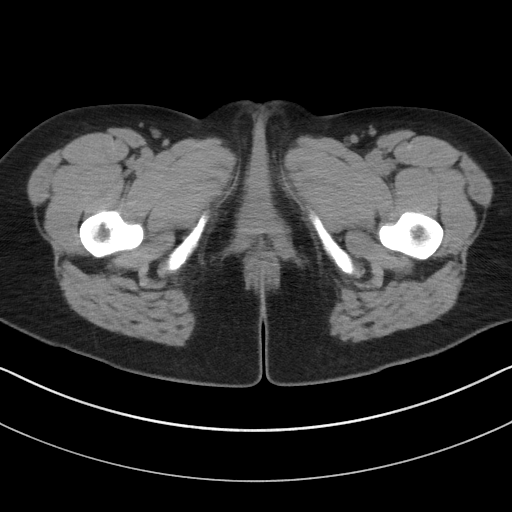
[im 19/95  soft-tissue]
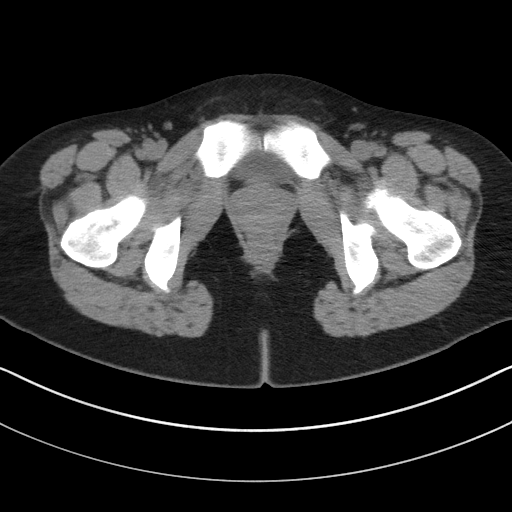
[im 27/95  soft-tissue]
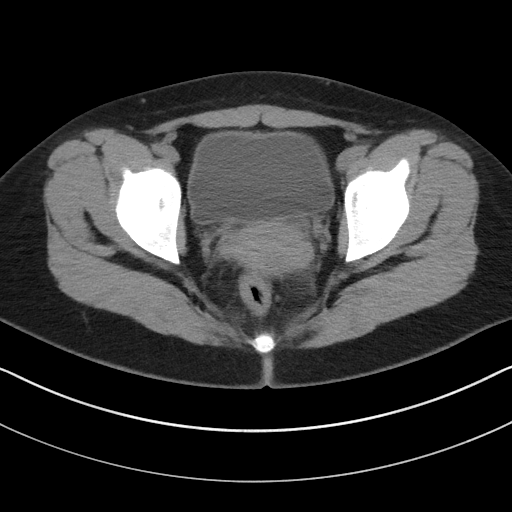
[im 34/95  soft-tissue]
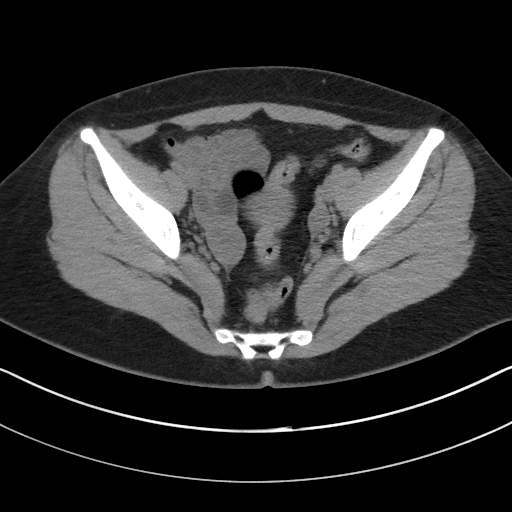
[im 42/95  soft-tissue]
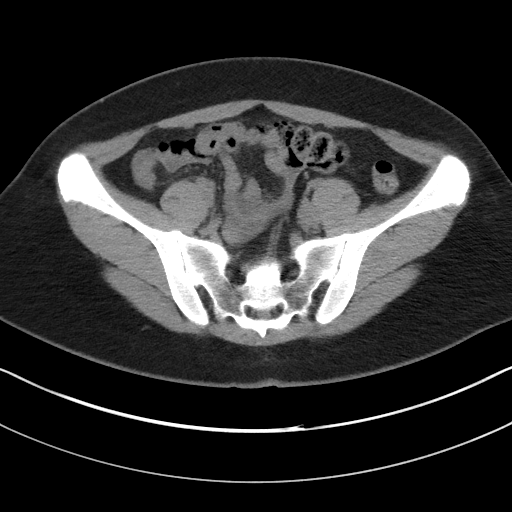
[im 49/95  soft-tissue]
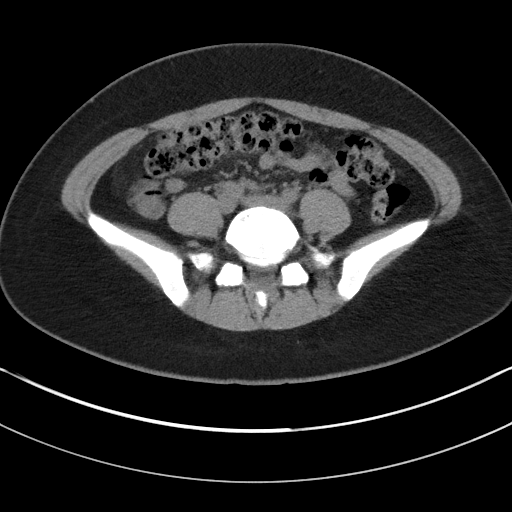
[im 53/95  soft-tissue]
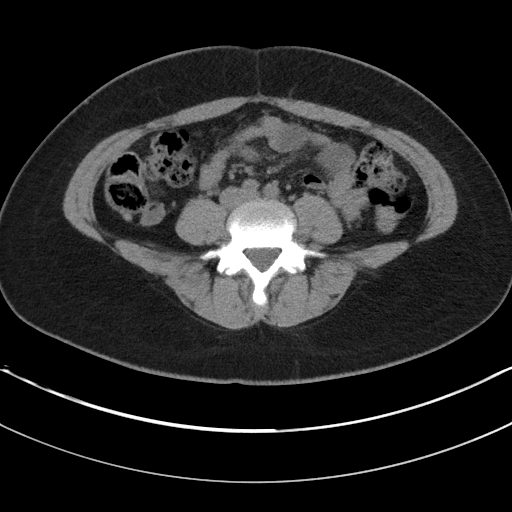
[im 61/95  soft-tissue]
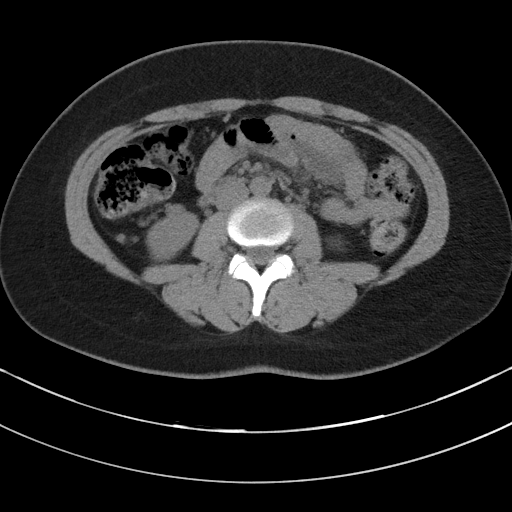
[im 61/95  bone]
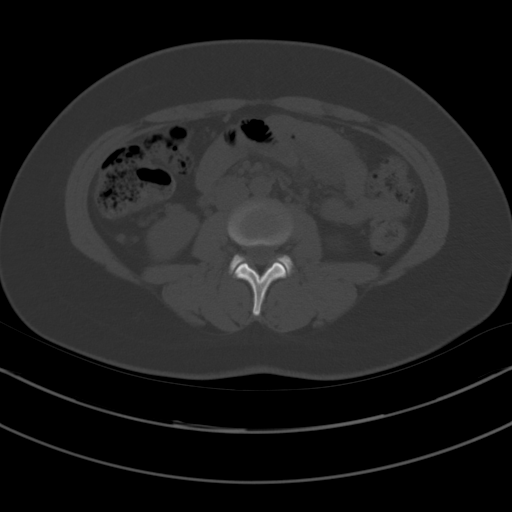
[im 68/95  soft-tissue]
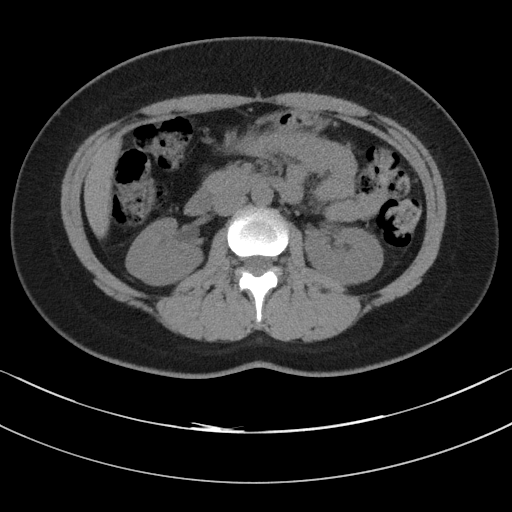
[im 76/95  soft-tissue]
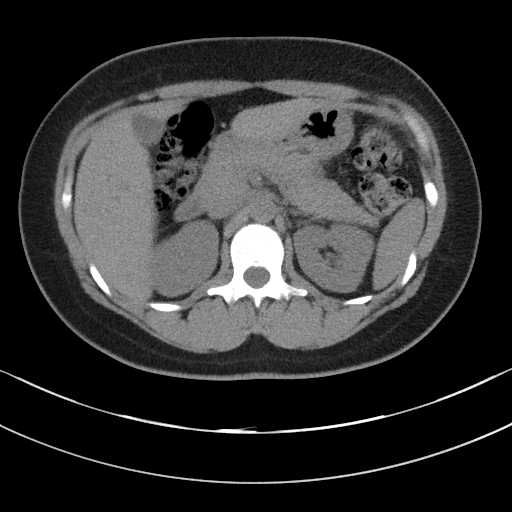
[im 83/95  soft-tissue]
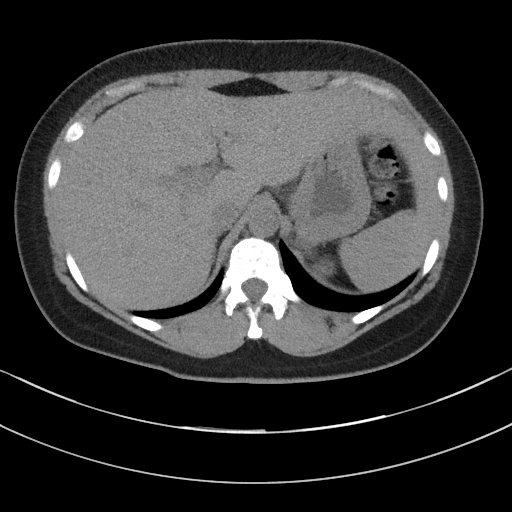
[im 91/95  soft-tissue]
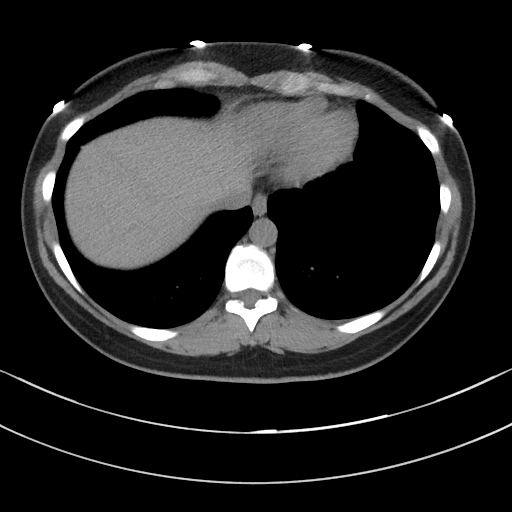

[Series 5: coronal st · coronal · 0.77mm/px · 3 of 83 slices shown]
[im 28/83  soft-tissue]
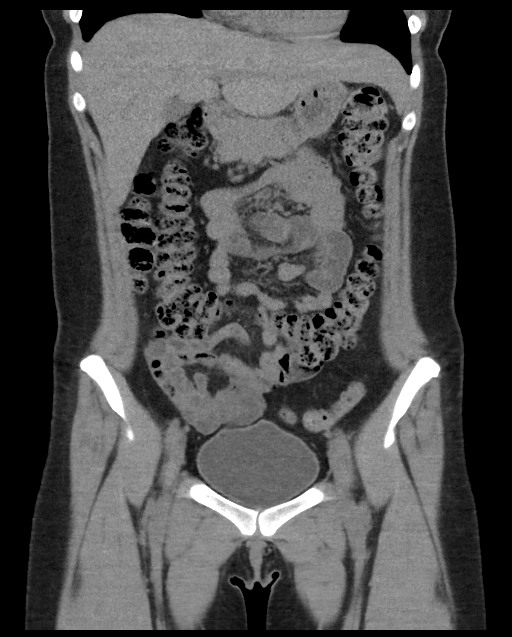
[im 37/83  soft-tissue]
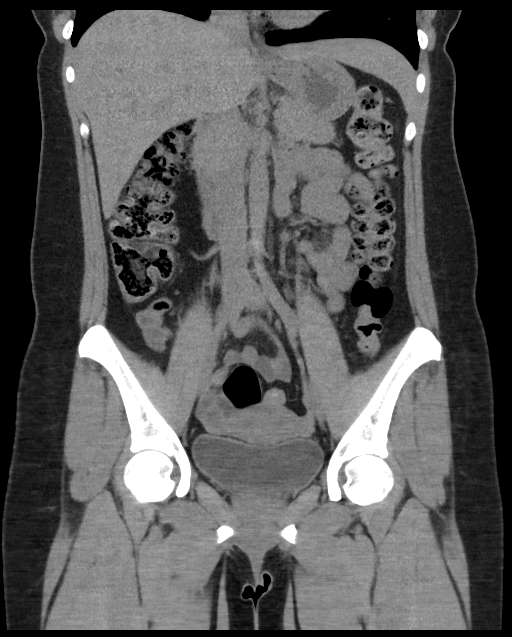
[im 46/83  soft-tissue]
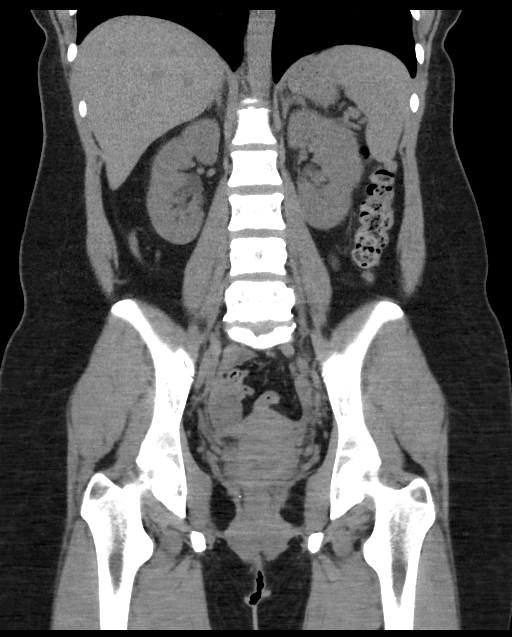

[16 of 46 positions shown; findings below may reference images not displayed]

FINDINGS: Lower chest: No acute abnormality.

Hepatobiliary: No focal liver abnormality is seen. No gallstones,
gallbladder wall thickening, or biliary dilatation.

Pancreas: Unremarkable. No pancreatic ductal dilatation or
surrounding inflammatory changes.

Spleen: Normal in size without focal abnormality.

Adrenals/Urinary Tract: Adrenal glands are within normal limits.
Kidneys are well visualized without renal calculi. Very minimal
fullness of the right renal collecting system is noted without
definitive stone. This may be related to edema from recently passed
stone. The bladder is well distended.

Stomach/Bowel: Stomach is within normal limits. Appendix appears
normal. No evidence of bowel wall thickening, distention, or
inflammatory changes.

Vascular/Lymphatic: No significant vascular findings are present. No
enlarged abdominal or pelvic lymph nodes.

Reproductive: The uterus is within normal limits. Bilateral ovarian
cystic changes are seen slightly more dominant on the right
measuring 2.9 cm in greatest dimension.

Other: Minimal free pelvic fluid is noted likely physiologic in
nature.

Musculoskeletal: No acute or significant osseous findings.
IMPRESSION: Ovarian cystic change without complicating factors.

Minimal fullness of the right renal collecting system and ureter
which may be related to edema from recently passed stone. No
definitive calculi are seen.

## 2019-05-28 ENCOUNTER — Ambulatory Visit: Payer: No Typology Code available for payment source | Admitting: Family Medicine

## 2019-06-06 ENCOUNTER — Ambulatory Visit: Payer: No Typology Code available for payment source | Admitting: Family Medicine

## 2019-06-06 ENCOUNTER — Ambulatory Visit: Payer: 59 | Admitting: Medical

## 2019-06-06 ENCOUNTER — Other Ambulatory Visit: Payer: Self-pay

## 2019-06-06 VITALS — BP 118/84 | HR 75 | Temp 97.9°F | Resp 16 | Ht 65.0 in | Wt 174.0 lb

## 2019-06-06 DIAGNOSIS — Z0000 Encounter for general adult medical examination without abnormal findings: Secondary | ICD-10-CM | POA: Diagnosis not present

## 2019-06-06 DIAGNOSIS — Z113 Encounter for screening for infections with a predominantly sexual mode of transmission: Secondary | ICD-10-CM | POA: Diagnosis not present

## 2019-06-06 LAB — COMPREHENSIVE METABOLIC PANEL
ALT: 12 U/L (ref 0–35)
AST: 14 U/L (ref 0–37)
Albumin: 4.4 g/dL (ref 3.5–5.2)
Alkaline Phosphatase: 50 U/L (ref 39–117)
BUN: 8 mg/dL (ref 6–23)
CO2: 26 mEq/L (ref 19–32)
Calcium: 9.6 mg/dL (ref 8.4–10.5)
Chloride: 102 mEq/L (ref 96–112)
Creatinine, Ser: 0.71 mg/dL (ref 0.40–1.20)
GFR: 95.61 mL/min (ref >60.00)
Glucose, Bld: 88 mg/dL (ref 70–99)
Potassium: 4.5 mEq/L (ref 3.5–5.1)
Sodium: 137 mEq/L (ref 135–145)
Total Bilirubin: 0.5 mg/dL (ref 0.2–1.2)
Total Protein: 7.7 g/dL (ref 6.0–8.3)

## 2019-06-06 LAB — LIPID PANEL
Cholesterol: 178 mg/dL (ref 0–200)
HDL: 47.1 mg/dL (ref >39.00)
LDL Cholesterol: 115 mg/dL — ABNORMAL HIGH (ref 0–99)
NonHDL: 130.64
Total CHOL/HDL Ratio: 4
Triglycerides: 76 mg/dL (ref 0.0–149.0)
VLDL: 15.2 mg/dL (ref 0.0–40.0)

## 2019-06-06 LAB — CBC WITH DIFFERENTIAL/PLATELET
Basophils Absolute: 0.1 10*3/uL (ref 0.0–0.1)
Basophils Relative: 1.2 % (ref 0.0–3.0)
Eosinophils Absolute: 0.2 10*3/uL (ref 0.0–0.7)
Eosinophils Relative: 3.5 % (ref 0.0–5.0)
HCT: 40.6 % (ref 36.0–46.0)
Hemoglobin: 13.6 g/dL (ref 12.0–15.0)
Lymphocytes Relative: 37.6 % (ref 12.0–46.0)
Lymphs Abs: 2.3 10*3/uL (ref 0.7–4.0)
MCHC: 33.4 g/dL (ref 30.0–36.0)
MCV: 90.8 fl (ref 78.0–100.0)
Monocytes Absolute: 0.4 10*3/uL (ref 0.1–1.0)
Monocytes Relative: 6.6 % (ref 3.0–12.0)
Neutro Abs: 3.1 10*3/uL (ref 1.4–7.7)
Neutrophils Relative %: 51.1 % (ref 43.0–77.0)
Platelets: 376 10*3/uL (ref 150.0–400.0)
RBC: 4.48 Mil/uL (ref 3.87–5.11)
RDW: 12.5 % (ref 11.5–15.5)
WBC: 6.2 10*3/uL (ref 4.0–10.5)

## 2019-06-06 NOTE — Patient Instructions (Addendum)
For you wellness exam today I have ordered cbc, cmp, lipid panel and hiv.  Up to date on flu vaccine  Recommend exercise and healthy diet.  We will let you know lab results as they come in.  Follow up date appointment will be determined after lab review.   Rare migraine if more recurrent or severe let us know. Could rx migraine specific meds.   Preventive Care 57-31 Years Old, Female Preventive care refers to visits with your health care provider and lifestyle choices that can promote health and wellness. This includes:  A yearly physical exam. This may also be called an annual well check.  Regular dental visits and eye exams.  Immunizations.  Screening for certain conditions.  Healthy lifestyle choices, such as eating a healthy diet, getting regular exercise, not using drugs or products that contain nicotine and tobacco, and limiting alcohol use. What can I expect for my preventive care visit? Physical exam Your health care provider will check your:  Height and weight. This may be used to calculate body mass index (BMI), which tells if you are at a healthy weight.  Heart rate and blood pressure.  Skin for abnormal spots. Counseling Your health care provider may ask you questions about your:  Alcohol, tobacco, and drug use.  Emotional well-being.  Home and relationship well-being.  Sexual activity.  Eating habits.  Work and work Statistician.  Method of birth control.  Menstrual cycle.  Pregnancy history. What immunizations do I need?  Influenza (flu) vaccine  This is recommended every year. Tetanus, diphtheria, and pertussis (Tdap) vaccine  You may need a Td booster every 10 years. Varicella (chickenpox) vaccine  You may need this if you have not been vaccinated. Human papillomavirus (HPV) vaccine  If recommended by your health care provider, you may need three doses over 6 months. Measles, mumps, and rubella (MMR) vaccine  You may need at least  one dose of MMR. You may also need a second dose. Meningococcal conjugate (MenACWY) vaccine  One dose is recommended if you are age 76-21 years and a first-year college student living in a residence hall, or if you have one of several medical conditions. You may also need additional booster doses. Pneumococcal conjugate (PCV13) vaccine  You may need this if you have certain conditions and were not previously vaccinated. Pneumococcal polysaccharide (PPSV23) vaccine  You may need one or two doses if you smoke cigarettes or if you have certain conditions. Hepatitis A vaccine  You may need this if you have certain conditions or if you travel or work in places where you may be exposed to hepatitis A. Hepatitis B vaccine  You may need this if you have certain conditions or if you travel or work in places where you may be exposed to hepatitis B. Haemophilus influenzae type b (Hib) vaccine  You may need this if you have certain conditions. You may receive vaccines as individual doses or as more than one vaccine together in one shot (combination vaccines). Talk with your health care provider about the risks and benefits of combination vaccines. What tests do I need?  Blood tests  Lipid and cholesterol levels. These may be checked every 5 years starting at age 31.  Hepatitis C test.  Hepatitis B test. Screening  Diabetes screening. This is done by checking your blood sugar (glucose) after you have not eaten for a while (fasting).  Sexually transmitted disease (STD) testing.  BRCA-related cancer screening. This may be done if you have a  family history of breast, ovarian, tubal, or peritoneal cancers.  Pelvic exam and Pap test. This may be done every 3 years starting at age 64. Starting at age 5, this may be done every 5 years if you have a Pap test in combination with an HPV test. Talk with your health care provider about your test results, treatment options, and if necessary, the need  for more tests. Follow these instructions at home: Eating and drinking   Eat a diet that includes fresh fruits and vegetables, whole grains, lean protein, and low-fat dairy.  Take vitamin and mineral supplements as recommended by your health care provider.  Do not drink alcohol if: ? Your health care provider tells you not to drink. ? You are pregnant, may be pregnant, or are planning to become pregnant.  If you drink alcohol: ? Limit how much you have to 0-1 drink a day. ? Be aware of how much alcohol is in your drink. In the [place] one drink equals one 12 oz bottle of beer (355 mL), one 5 oz glass of wine (148 mL), or one 1 oz glass of hard liquor (44 mL). Lifestyle  Take daily care of your teeth and gums.  Stay active. Exercise for at least 30 minutes on 5 or more days each week.  Do not use any products that contain nicotine or tobacco, such as cigarettes, e-cigarettes, and chewing tobacco. If you need help quitting, ask your health care provider.  If you are sexually active, practice safe sex. Use a condom or other form of birth control (contraception) in order to prevent pregnancy and STIs (sexually transmitted infections). If you plan to become pregnant, see your health care provider for a preconception visit. What's next?  Visit your health care provider once a year for a well check visit.  Ask your health care provider how often you should have your eyes and teeth checked.  Stay up to date on all vaccines. This information is not intended to replace advice given to you by your health care provider. Make sure you discuss any questions you have with your health care provider. Document Released: 10/05/2001 Document Revised: 04/20/2018 Document Reviewed: 04/20/2018 Elsevier Patient Education  2020 [employer].

## 2019-06-06 NOTE — Progress Notes (Signed)
Subjective:    Patient ID: Melissa Robles, female    DOB: 02-10-1986, 33 y.o.   MRN: [id number]  HPI  Patient is here for a general follow-up. Reports she has not been to the doctor in over a year and she wants to be sure she is up to date on everything and she is ok with getting wellness exam.    Hx of migraines - reports they have been under control. Only once or twice in the past year. She only has to use Excedrin and rest when they do come. Usually resolves after sleeping, never carries over into the next day.  Rare alcohol use, 1-2x/month. Nonsmoker. No recreational drugs.  Reports regular exercise about 3x/week including running and weight lifting. Fairly healthy diet. She is working to lose weight. 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   LMP: 06/08/19, regular periods. Last pap normal in 2018.  Fasting today.   Review of Systems  Constitutional: Negative for chills, fatigue and fever.  HENT: Negative for congestion, sinus pressure and sore throat.   Respiratory: Negative for cough, chest tightness and shortness of breath.   Gastrointestinal: Negative for abdominal pain, blood in stool, constipation and diarrhea.  Endocrine: Negative for polydipsia, polyphagia and polyuria.  Genitourinary: Negative for dysuria, flank pain, frequency and urgency.  Musculoskeletal: Negative for arthralgias and myalgias.  Skin: Negative for rash.  Neurological: Negative for dizziness, light-headedness and headaches.       No recent ha. Some sound and light sensitive. She can't remember last ha.  Psychiatric/Behavioral: Negative for self-injury and sleep disturbance. The patient is not nervous/anxious.     Past Medical History:  Diagnosis Date  . Chicken pox   . Frequent headaches   . Migraine      Social History   Socioeconomic History  . Marital status: Married    Spouse name: Not on file  . Number of children: Not on file  . Years of education: Not on  file  . Highest education level: Not on file  Occupational History  . 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 Occupation: Cytogeneticist  Social Needs  . Financial resource strain: Not on file  . Food insecurity    Worry: Not on file    Inability: Not on file  . Transportation needs    Medical: Not on file    Non-medical: Not on file  Tobacco Use  . Smoking status: Former Smoker    Types: Cigarettes    Quit date: 01/14/2014    Years since quitting: 5.3  . Smokeless tobacco: Never Used  . Tobacco comment: 1-2 cig a week  Substance and Sexual Activity  . Alcohol use: No    Alcohol/week: 0.0 standard drinks    Comment: Socially--1 every 2-3 weeks  . Drug use: No    Frequency: 1.0 times per week    Comment: Marijuana occasionally--stopped  . Sexual activity: Yes    Partners: Male    Birth control/protection: Condom, Pill  Lifestyle  . Physical activity    Days per week: Not on file    Minutes per session: Not on file  . Stress: Not on file  Relationships  . Social Herbalist on phone: Not on file    Gets together: Not on file    Attends religious service: Not  on file    Active member of club or organization: Not on file    Attends meetings of clubs or organizations: Not on file    Relationship status: Not on file  . Intimate partner violence    Fear of current or ex partner: Not on file    Emotionally abused: Not on file    Physically abused: Not on file    Forced sexual activity: Not on file  Other Topics Concern  . Not on file  Social History Narrative   Exercise-- no    Past Surgical History:  Procedure Laterality Date  . WISDOM TOOTH EXTRACTION      Family History  Problem Relation Age of Onset  . Alcohol abuse Brother   . Alcohol abuse Father   . Sudden death Father 25       Unknown cause of death  . Hypertension Mother  51  . Hypercholesterolemia Mother 41  . Alcohol abuse Paternal Uncle   . Alcohol abuse Paternal Grandmother   . Sudden death Paternal Uncle        6 uncles died before they were 76   . Thyroid disease Maternal Grandfather     No Known Allergies  Current Outpatient Medications on File Prior to Visit  Medication Sig Dispense Refill  . Multiple Vitamin (MULTIVITAMIN) tablet Take 1 tablet by mouth daily.     No current facility-administered medications on file prior to visit.     BP 118/84   Pulse 75   Temp 97.9 F (36.6 C) (Oral)   Resp 16   Ht 5\' 5"  (1.651 m)   Wt 174 lb (78.9 kg)   SpO2 98%   BMI 28.96 kg/m       Objective:   Physical Exam  General Mental Status- Alert. General Appearance- Not in acute distress.   Skin General: Color- Normal Color. Moisture- Normal Moisture.  Neck Carotid Arteries- Normal color. Moisture- Normal Moisture. No carotid bruits. No JVD.  Chest and Lung Exam Auscultation: Breath Sounds:-Normal.  Cardiovascular Auscultation:Rythm- Regular. Murmurs & Other Heart Sounds:Auscultation of the heart reveals- No Murmurs.  Abdomen Inspection:-Inspeection Normal. Palpation/Percussion:Note:No mass. Palpation and Percussion of the abdomen reveal- Non Tender, Non Distended + BS, no rebound or guarding.    Neurologic Cranial Nerve exam:- CN III-XII intact(No nystagmus), symmetric smile. Strength:- 5/5 equal and symmetric strength both upper and lower extremities.      Assessment & Plan:  For you wellness exam today I have ordered cbc, cmp, lipid panel and hiv.  Up to date on flu vaccine  Recommend exercise and healthy diet.  We will let you know lab results as they come in.  Follow up date appointment will be determined after lab review.   Mackie Pai, PA-C

## 2019-06-07 LAB — HIV ANTIBODY (ROUTINE TESTING W REFLEX): HIV 1&2 Ab, 4th Generation: NONREACTIVE

## 2019-06-12 DIAGNOSIS — H5213 Myopia, bilateral: Secondary | ICD-10-CM | POA: Diagnosis not present

## 2019-10-10 DIAGNOSIS — Z03818 Encounter for observation for suspected exposure to other biological agents ruled out: Secondary | ICD-10-CM | POA: Diagnosis not present

## 2019-10-10 DIAGNOSIS — Z20828 Contact with and (suspected) exposure to other viral communicable diseases: Secondary | ICD-10-CM | POA: Diagnosis not present

## 2019-10-15 DIAGNOSIS — D2239 Melanocytic nevi of other parts of face: Secondary | ICD-10-CM | POA: Diagnosis not present

## 2019-10-15 DIAGNOSIS — L218 Other seborrheic dermatitis: Secondary | ICD-10-CM | POA: Diagnosis not present

## 2019-10-15 DIAGNOSIS — L650 Telogen effluvium: Secondary | ICD-10-CM | POA: Diagnosis not present

## 2019-10-15 DIAGNOSIS — D2339 Other benign neoplasm of skin of other parts of face: Secondary | ICD-10-CM | POA: Diagnosis not present

## 2019-10-17 MED FILL — KETOCONAZOLE 2 % SHAM: 2 | 30 days supply | Qty: 120 | Fill #0

## 2019-10-17 MED FILL — FLUOCINONIDE 0.05 % SOLN: 0.05 | 30 days supply | Qty: 60 | Fill #0

## 2019-11-02 ENCOUNTER — Encounter: Payer: Self-pay | Admitting: Family Medicine

## 2019-11-02 ENCOUNTER — Ambulatory Visit: Payer: 59 | Admitting: Family Medicine

## 2019-11-02 ENCOUNTER — Other Ambulatory Visit: Payer: Self-pay

## 2019-11-02 VITALS — BP 102/84 | HR 78 | Temp 97.4°F | Resp 18 | Ht 65.0 in | Wt 180.0 lb

## 2019-11-02 DIAGNOSIS — L659 Nonscarring hair loss, unspecified: Secondary | ICD-10-CM | POA: Insufficient documentation

## 2019-11-02 DIAGNOSIS — R635 Abnormal weight gain: Secondary | ICD-10-CM

## 2019-11-02 DIAGNOSIS — E785 Hyperlipidemia, unspecified: Secondary | ICD-10-CM

## 2019-11-02 DIAGNOSIS — R5383 Other fatigue: Secondary | ICD-10-CM | POA: Diagnosis not present

## 2019-11-02 NOTE — Patient Instructions (Signed)

## 2019-11-02 NOTE — Assessment & Plan Note (Signed)
Encouraged heart healthy diet, increase exercise, avoid trans fats, consider a krill oil cap daily 

## 2019-11-02 NOTE — Assessment & Plan Note (Signed)
Check labs  R/o thyroid

## 2019-11-02 NOTE — Assessment & Plan Note (Signed)
Check labs Discussed diet and exercise Discussed my fitness pal or other apps to keep track of food and exercise

## 2019-11-02 NOTE — Progress Notes (Signed)
Patient ID: Melissa Robles, female    DOB: 08/28/1985  Age: 34 y.o. MRN: [id number]    Subjective:  Subjective  HPI Melissa Robles presents for fatigue, weight gain and hair loss.  She has gained 30 lbs this year   She is concerned about her thyroid and cholesterol.  Review of Systems  Constitutional: Negative for appetite change, diaphoresis, fatigue and unexpected weight change.  Eyes: Negative for pain, redness and visual disturbance.  Respiratory: Negative for cough, chest tightness, shortness of breath and wheezing.   Cardiovascular: Negative for chest pain, palpitations and leg swelling.  Endocrine: Negative for cold intolerance, heat intolerance, polydipsia, polyphagia and polyuria.  Genitourinary: Negative for difficulty urinating, dysuria and frequency.  Neurological: Negative for dizziness, light-headedness, numbness and headaches.    History Past Medical History:  Diagnosis Date  . Chicken pox   . Frequent headaches   . Migraine     She has a past surgical history that includes Wisdom tooth extraction.   Her family history includes Alcohol abuse in her brother, father, paternal grandmother, and paternal uncle; Hypercholesterolemia (age of onset: 100) in her mother; Hypertension (age of onset: 26) in her mother; Sudden death in her paternal uncle; Sudden death (age of onset: 56) in her father; Thyroid disease in her maternal grandfather.She reports that she quit smoking about 5 years ago. Her smoking use included cigarettes. She has never used smokeless tobacco. She reports that she does not drink alcohol or use drugs.  Current Outpatient Medications on File Prior to Visit  Medication Sig Dispense Refill  . Multiple Vitamin (MULTIVITAMIN) tablet Take 1 tablet by mouth daily.     No current facility-administered medications on file prior to visit.     Objective:  Objective  Physical Exam Vitals and nursing note reviewed.  Constitutional:      Appearance: She is well-developed.    HENT:     Head: Normocephalic and atraumatic.  Eyes:     Conjunctiva/sclera: Conjunctivae normal.  Neck:     Thyroid: No thyromegaly.     Vascular: No carotid bruit or JVD.  Cardiovascular:     Rate and Rhythm: Normal rate and regular rhythm.     Heart sounds: Normal heart sounds. No murmur.  Pulmonary:     Effort: Pulmonary effort is normal. No respiratory distress.     Breath sounds: Normal breath sounds. No wheezing or rales.  Chest:     Chest wall: No tenderness.  Musculoskeletal:     Cervical back: Normal range of motion and neck supple.  Neurological:     Mental Status: She is alert and oriented to person, place, and time.    BP 102/84 (BP Location: Right Arm, Patient Position: Sitting, Cuff Size: Normal)   Pulse 78   Temp (!) 97.4 F (36.3 C) (Temporal)   Resp 18   Ht 5\' 5"  (1.651 m)   Wt 180 lb (81.6 kg)   SpO2 99%   BMI 29.95 kg/m  Wt Readings from Last 3 Encounters:  11/02/19 180 lb (81.6 kg)  06/06/19 174 lb (78.9 kg)  02/03/18 158 lb (71.7 kg)     Lab Results  Component Value Date   WBC 6.2 06/06/2019   HGB 13.6 06/06/2019   HCT 40.6 06/06/2019   PLT 376.0 06/06/2019   GLUCOSE 88 06/06/2019   CHOL 178 06/06/2019   TRIG 76.0 06/06/2019   HDL 47.10 06/06/2019   LDLCALC 115 (H) 06/06/2019   ALT 12 06/06/2019   AST 14 06/06/2019  NA 137 06/06/2019   K 4.5 06/06/2019   CL 102 06/06/2019   CREATININE 0.71 06/06/2019   BUN 8 06/06/2019   CO2 26 06/06/2019   TSH 0.68 06/27/2014    CT RENAL STONE STUDY  Result Date: 02/07/2018 CLINICAL DATA:  Right flank pain and hematuria. EXAM: CT ABDOMEN AND PELVIS WITHOUT CONTRAST TECHNIQUE: Multidetector CT imaging of the abdomen and pelvis was performed following the standard protocol without IV contrast. COMPARISON:  None. FINDINGS: Lower chest: No acute abnormality. Hepatobiliary: No focal liver abnormality is seen. No gallstones, gallbladder wall thickening, or biliary dilatation. Pancreas: Unremarkable. No  pancreatic ductal dilatation or surrounding inflammatory changes. Spleen: Normal in size without focal abnormality. Adrenals/Urinary Tract: Adrenal glands are within normal limits. Kidneys are well visualized without renal calculi. Very minimal fullness of the right renal collecting system is noted without definitive stone. This may be related to edema from recently passed stone. The bladder is well distended. Stomach/Bowel: Stomach is within normal limits. Appendix appears normal. No evidence of bowel wall thickening, distention, or inflammatory changes. Vascular/Lymphatic: No significant vascular findings are present. No enlarged abdominal or pelvic lymph nodes. Reproductive: The uterus is within normal limits. Bilateral ovarian cystic changes are seen slightly more dominant on the right measuring 2.9 cm in greatest dimension. Other: Minimal free pelvic fluid is noted likely physiologic in nature. Musculoskeletal: No acute or significant osseous findings. IMPRESSION: Ovarian cystic change without complicating factors. Minimal fullness of the right renal collecting system and ureter which may be related to edema from recently passed stone. No definitive calculi are seen. Electronically Signed   By: Inez Catalina M.D.   On: 02/07/2018 07:13     Assessment & Plan:  Plan  I am having Melissa Robles maintain her multivitamin.  No orders of the defined types were placed in this encounter.   Problem List Items Addressed This Visit      Unprioritized   Hair loss - Primary    Check labs  R/o thyroid       Relevant Orders   Thyroid Panel With TSH   Lipid panel   Vitamin B12   CBC with Differential/Platelet   Comprehensive metabolic panel   Hyperlipidemia    Encouraged heart healthy diet, increase exercise, avoid trans fats, consider a krill oil cap daily       Other fatigue    Check labs inc thyroid      Relevant Orders   Thyroid Panel With TSH   Lipid panel   Vitamin B12   CBC with  Differential/Platelet   Comprehensive metabolic panel   Weight gain    Check labs Discussed diet and exercise Discussed my fitness pal or other apps to keep track of food and exercise       Relevant Orders   Thyroid Panel With TSH   Lipid panel   Vitamin B12   CBC with Differential/Platelet   Comprehensive metabolic panel     Follow-up: Return if symptoms worsen or fail to improve.  Ann Held, DO

## 2019-11-02 NOTE — Assessment & Plan Note (Signed)
Check labs inc thyroid

## 2019-11-03 LAB — CBC WITH DIFFERENTIAL/PLATELET
Absolute Monocytes: 723 cells/uL (ref 200–950)
Basophils Absolute: 79 cells/uL (ref 0–200)
Basophils Relative: 0.8 %
Eosinophils Absolute: 356 cells/uL (ref 15–500)
Eosinophils Relative: 3.6 %
HCT: 39.9 % (ref 35.0–45.0)
Hemoglobin: 13.3 g/dL (ref 11.7–15.5)
Lymphs Abs: 3317 cells/uL (ref 850–3900)
MCH: 30.4 pg (ref 27.0–33.0)
MCHC: 33.3 g/dL (ref 32.0–36.0)
MCV: 91.1 fL (ref 80.0–100.0)
MPV: 9.8 fL (ref 7.5–12.5)
Monocytes Relative: 7.3 %
Neutro Abs: 5425 cells/uL (ref 1500–7800)
Neutrophils Relative %: 54.8 %
Platelets: 416 10*3/uL — ABNORMAL HIGH (ref 140–400)
RBC: 4.38 10*6/uL (ref 3.80–5.10)
RDW: 12 % (ref 11.0–15.0)
Total Lymphocyte: 33.5 %
WBC: 9.9 10*3/uL (ref 3.8–10.8)

## 2019-11-03 LAB — COMPREHENSIVE METABOLIC PANEL
AG Ratio: 1.5 (calc) (ref 1.0–2.5)
ALT: 39 U/L — ABNORMAL HIGH (ref 6–29)
AST: 32 U/L — ABNORMAL HIGH (ref 10–30)
Albumin: 4.5 g/dL (ref 3.6–5.1)
Alkaline phosphatase (APISO): 53 U/L (ref 31–125)
BUN: 11 mg/dL (ref 7–25)
CO2: 24 mmol/L (ref 20–32)
Calcium: 9.7 mg/dL (ref 8.6–10.2)
Chloride: 102 mmol/L (ref 98–110)
Creat: 0.63 mg/dL (ref 0.50–1.10)
Globulin: 3.1 g/dL (calc) (ref 1.9–3.7)
Glucose, Bld: 88 mg/dL (ref 65–99)
Potassium: 4.7 mmol/L (ref 3.5–5.3)
Sodium: 138 mmol/L (ref 135–146)
Total Bilirubin: 0.3 mg/dL (ref 0.2–1.2)
Total Protein: 7.6 g/dL (ref 6.1–8.1)

## 2019-11-03 LAB — VITAMIN B12: Vitamin B-12: 662 pg/mL (ref 200–1100)

## 2019-11-03 LAB — LIPID PANEL
Cholesterol: 192 mg/dL (ref <200)
HDL: 50 mg/dL (ref >=50)
LDL Cholesterol (Calc): 114 mg/dL (calc) — ABNORMAL HIGH
Non-HDL Cholesterol (Calc): 142 mg/dL (calc) — ABNORMAL HIGH (ref <130)
Total CHOL/HDL Ratio: 3.8 (calc) (ref <5.0)
Triglycerides: 165 mg/dL — ABNORMAL HIGH (ref <150)

## 2019-11-03 LAB — THYROID PANEL WITH TSH
Free Thyroxine Index: 2.3 (ref 1.4–3.8)
T3 Uptake: 33 % (ref 22–35)
T4, Total: 7.1 ug/dL (ref 5.1–11.9)
TSH: 3.52 mIU/L

## 2019-11-07 ENCOUNTER — Other Ambulatory Visit: Payer: Self-pay | Admitting: Family Medicine

## 2019-11-07 DIAGNOSIS — E781 Pure hyperglyceridemia: Secondary | ICD-10-CM

## 2020-05-26 ENCOUNTER — Other Ambulatory Visit: Payer: 59

## 2020-06-17 ENCOUNTER — Ambulatory Visit: Payer: 59 | Admitting: Family Medicine

## 2020-08-19 ENCOUNTER — Other Ambulatory Visit (HOSPITAL_COMMUNITY): Payer: Self-pay

## 2020-08-19 MED FILL — FLUoxetine HCL 40 MG CAPS: 40 | 30 days supply | Qty: 30 | Fill #0

## 2020-09-18 MED FILL — FLUoxetine HCL 40 MG CAPS: 40 | 30 days supply | Qty: 30 | Fill #1

## 2020-10-22 MED FILL — FLUoxetine HCL 40 MG CAPS: 40 | 30 days supply | Qty: 30 | Fill #2

## 2020-11-20 ENCOUNTER — Other Ambulatory Visit (HOSPITAL_COMMUNITY): Payer: Self-pay

## 2020-11-24 ENCOUNTER — Other Ambulatory Visit (HOSPITAL_COMMUNITY): Payer: Self-pay

## 2020-11-24 MED FILL — Fluoxetine HCl Cap 40 MG: ORAL | 30 days supply | Qty: 30 | Fill #0 | Status: AC

## 2020-11-25 ENCOUNTER — Other Ambulatory Visit (HOSPITAL_COMMUNITY): Payer: Self-pay

## 2021-01-22 ENCOUNTER — Other Ambulatory Visit (HOSPITAL_COMMUNITY): Payer: Self-pay

## 2021-01-22 MED ORDER — CARESTART COVID-19 HOME TEST VI KIT
PACK | 0 refills | Status: DC
  Filled 2021-01-22: qty 4, 4d supply, fill #0

## 2021-01-23 ENCOUNTER — Other Ambulatory Visit (HOSPITAL_COMMUNITY): Payer: Self-pay

## 2021-01-29 ENCOUNTER — Other Ambulatory Visit (HOSPITAL_COMMUNITY): Payer: Self-pay

## 2021-01-29 MED ORDER — FLUOXETINE HCL 40 MG PO CAPS
40.0000 mg | ORAL_CAPSULE | Freq: Every morning | ORAL | 0 refills | Status: DC
  Filled 2021-01-29: qty 30, 30d supply, fill #0

## 2021-02-03 ENCOUNTER — Other Ambulatory Visit (HOSPITAL_COMMUNITY): Payer: Self-pay

## 2021-02-04 ENCOUNTER — Other Ambulatory Visit (HOSPITAL_COMMUNITY): Payer: Self-pay

## 2021-02-06 ENCOUNTER — Other Ambulatory Visit (HOSPITAL_COMMUNITY): Payer: Self-pay

## 2021-02-06 MED ORDER — FLUOXETINE HCL 40 MG PO CAPS
40.0000 mg | ORAL_CAPSULE | Freq: Every morning | ORAL | 1 refills | Status: DC
  Filled 2021-02-06 – 2021-05-04 (×2): qty 90, 90d supply, fill #0

## 2021-04-06 DIAGNOSIS — M9902 Segmental and somatic dysfunction of thoracic region: Secondary | ICD-10-CM | POA: Diagnosis not present

## 2021-04-06 DIAGNOSIS — M546 Pain in thoracic spine: Secondary | ICD-10-CM | POA: Diagnosis not present

## 2021-04-06 DIAGNOSIS — M9903 Segmental and somatic dysfunction of lumbar region: Secondary | ICD-10-CM | POA: Diagnosis not present

## 2021-04-06 DIAGNOSIS — M542 Cervicalgia: Secondary | ICD-10-CM | POA: Diagnosis not present

## 2021-04-06 DIAGNOSIS — M62830 Muscle spasm of back: Secondary | ICD-10-CM | POA: Diagnosis not present

## 2021-04-06 DIAGNOSIS — M62838 Other muscle spasm: Secondary | ICD-10-CM | POA: Diagnosis not present

## 2021-04-06 DIAGNOSIS — M545 Low back pain, unspecified: Secondary | ICD-10-CM | POA: Diagnosis not present

## 2021-04-06 DIAGNOSIS — M9901 Segmental and somatic dysfunction of cervical region: Secondary | ICD-10-CM | POA: Diagnosis not present

## 2021-04-07 DIAGNOSIS — M9902 Segmental and somatic dysfunction of thoracic region: Secondary | ICD-10-CM | POA: Diagnosis not present

## 2021-04-07 DIAGNOSIS — M62838 Other muscle spasm: Secondary | ICD-10-CM | POA: Diagnosis not present

## 2021-04-07 DIAGNOSIS — M9901 Segmental and somatic dysfunction of cervical region: Secondary | ICD-10-CM | POA: Diagnosis not present

## 2021-04-07 DIAGNOSIS — M9903 Segmental and somatic dysfunction of lumbar region: Secondary | ICD-10-CM | POA: Diagnosis not present

## 2021-04-07 DIAGNOSIS — M542 Cervicalgia: Secondary | ICD-10-CM | POA: Diagnosis not present

## 2021-04-07 DIAGNOSIS — M545 Low back pain, unspecified: Secondary | ICD-10-CM | POA: Diagnosis not present

## 2021-04-07 DIAGNOSIS — M546 Pain in thoracic spine: Secondary | ICD-10-CM | POA: Diagnosis not present

## 2021-04-07 DIAGNOSIS — M62830 Muscle spasm of back: Secondary | ICD-10-CM | POA: Diagnosis not present

## 2021-04-29 DIAGNOSIS — M9902 Segmental and somatic dysfunction of thoracic region: Secondary | ICD-10-CM | POA: Diagnosis not present

## 2021-04-29 DIAGNOSIS — M546 Pain in thoracic spine: Secondary | ICD-10-CM | POA: Diagnosis not present

## 2021-04-29 DIAGNOSIS — M545 Low back pain, unspecified: Secondary | ICD-10-CM | POA: Diagnosis not present

## 2021-04-29 DIAGNOSIS — M62830 Muscle spasm of back: Secondary | ICD-10-CM | POA: Diagnosis not present

## 2021-04-29 DIAGNOSIS — M542 Cervicalgia: Secondary | ICD-10-CM | POA: Diagnosis not present

## 2021-04-29 DIAGNOSIS — M9901 Segmental and somatic dysfunction of cervical region: Secondary | ICD-10-CM | POA: Diagnosis not present

## 2021-04-29 DIAGNOSIS — M62838 Other muscle spasm: Secondary | ICD-10-CM | POA: Diagnosis not present

## 2021-04-29 DIAGNOSIS — M9903 Segmental and somatic dysfunction of lumbar region: Secondary | ICD-10-CM | POA: Diagnosis not present

## 2021-05-04 ENCOUNTER — Other Ambulatory Visit (HOSPITAL_COMMUNITY): Payer: Self-pay

## 2021-05-04 DIAGNOSIS — M9901 Segmental and somatic dysfunction of cervical region: Secondary | ICD-10-CM | POA: Diagnosis not present

## 2021-05-04 DIAGNOSIS — M542 Cervicalgia: Secondary | ICD-10-CM | POA: Diagnosis not present

## 2021-05-04 DIAGNOSIS — M545 Low back pain, unspecified: Secondary | ICD-10-CM | POA: Diagnosis not present

## 2021-05-04 DIAGNOSIS — M9902 Segmental and somatic dysfunction of thoracic region: Secondary | ICD-10-CM | POA: Diagnosis not present

## 2021-05-04 DIAGNOSIS — M546 Pain in thoracic spine: Secondary | ICD-10-CM | POA: Diagnosis not present

## 2021-05-04 DIAGNOSIS — M62830 Muscle spasm of back: Secondary | ICD-10-CM | POA: Diagnosis not present

## 2021-05-04 DIAGNOSIS — M9903 Segmental and somatic dysfunction of lumbar region: Secondary | ICD-10-CM | POA: Diagnosis not present

## 2021-05-04 DIAGNOSIS — M62838 Other muscle spasm: Secondary | ICD-10-CM | POA: Diagnosis not present

## 2021-05-19 ENCOUNTER — Encounter: Payer: 59 | Admitting: Family Medicine

## 2021-07-27 ENCOUNTER — Encounter: Payer: Self-pay | Admitting: Family Medicine

## 2021-07-27 ENCOUNTER — Ambulatory Visit (INDEPENDENT_AMBULATORY_CARE_PROVIDER_SITE_OTHER): Payer: 59 | Admitting: Family Medicine

## 2021-07-27 ENCOUNTER — Other Ambulatory Visit (HOSPITAL_BASED_OUTPATIENT_CLINIC_OR_DEPARTMENT_OTHER): Payer: Self-pay

## 2021-07-27 VITALS — BP 130/90 | HR 66 | Temp 97.9°F | Resp 18 | Ht 65.0 in | Wt 180.0 lb

## 2021-07-27 DIAGNOSIS — Z0000 Encounter for general adult medical examination without abnormal findings: Secondary | ICD-10-CM | POA: Diagnosis not present

## 2021-07-27 DIAGNOSIS — Z1159 Encounter for screening for other viral diseases: Secondary | ICD-10-CM

## 2021-07-27 DIAGNOSIS — F418 Other specified anxiety disorders: Secondary | ICD-10-CM | POA: Diagnosis not present

## 2021-07-27 LAB — LIPID PANEL
Cholesterol: 191 mg/dL (ref 0–200)
HDL: 48.9 mg/dL (ref >39.00)
LDL Cholesterol: 118 mg/dL — ABNORMAL HIGH (ref 0–99)
NonHDL: 142.46
Total CHOL/HDL Ratio: 4
Triglycerides: 120 mg/dL (ref 0.0–149.0)
VLDL: 24 mg/dL (ref 0.0–40.0)

## 2021-07-27 LAB — COMPREHENSIVE METABOLIC PANEL
ALT: 19 U/L (ref 0–35)
AST: 27 U/L (ref 0–37)
Albumin: 4.3 g/dL (ref 3.5–5.2)
Alkaline Phosphatase: 58 U/L (ref 39–117)
BUN: 11 mg/dL (ref 6–23)
CO2: 27 mEq/L (ref 19–32)
Calcium: 9.4 mg/dL (ref 8.4–10.5)
Chloride: 103 mEq/L (ref 96–112)
Creatinine, Ser: 0.73 mg/dL (ref 0.40–1.20)
GFR: 107.77 mL/min (ref >60.00)
Glucose, Bld: 75 mg/dL (ref 70–99)
Potassium: 4.2 mEq/L (ref 3.5–5.1)
Sodium: 137 mEq/L (ref 135–145)
Total Bilirubin: 0.4 mg/dL (ref 0.2–1.2)
Total Protein: 7.4 g/dL (ref 6.0–8.3)

## 2021-07-27 LAB — CBC WITH DIFFERENTIAL/PLATELET
Basophils Absolute: 0.1 10*3/uL (ref 0.0–0.1)
Basophils Relative: 0.7 % (ref 0.0–3.0)
Eosinophils Absolute: 0 10*3/uL (ref 0.0–0.7)
Eosinophils Relative: 0.4 % (ref 0.0–5.0)
HCT: 39.6 % (ref 36.0–46.0)
Hemoglobin: 13.1 g/dL (ref 12.0–15.0)
Lymphocytes Relative: 35.6 % (ref 12.0–46.0)
Lymphs Abs: 3.3 10*3/uL (ref 0.7–4.0)
MCHC: 33 g/dL (ref 30.0–36.0)
MCV: 93.9 fl (ref 78.0–100.0)
Monocytes Absolute: 0.5 10*3/uL (ref 0.1–1.0)
Monocytes Relative: 5.7 % (ref 3.0–12.0)
Neutro Abs: 5.4 10*3/uL (ref 1.4–7.7)
Neutrophils Relative %: 57.6 % (ref 43.0–77.0)
Platelets: 392 10*3/uL (ref 150.0–400.0)
RBC: 4.22 Mil/uL (ref 3.87–5.11)
RDW: 14.1 % (ref 11.5–15.5)
WBC: 9.4 10*3/uL (ref 4.0–10.5)

## 2021-07-27 LAB — TSH: TSH: 1.33 u[IU]/mL (ref 0.35–5.50)

## 2021-07-27 MED ORDER — FLUOXETINE HCL 40 MG PO CAPS
40.0000 mg | ORAL_CAPSULE | Freq: Every morning | ORAL | 3 refills | Status: DC
Start: 2021-07-27 — End: 2022-08-10
  Filled 2021-07-27: qty 90, 90d supply, fill #0
  Filled 2021-10-23: qty 90, 90d supply, fill #1
  Filled 2022-02-12: qty 90, 90d supply, fill #2
  Filled 2022-05-21: qty 90, 90d supply, fill #3

## 2021-07-27 NOTE — Patient Instructions (Signed)

## 2021-07-27 NOTE — Progress Notes (Signed)
Subjective:     Melissa Robles is a 35 y.o. female and is here for a comprehensive physical exam. The patient reports problems - being treated for depression/ anxiety with [facility]. Marland Kitchen   She is taking the prozac and stopped the counseling.  She is doing well .    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   Occupation: Occupational psychologist [employer]  Tobacco Use   Smoking status: Former    Types: Cigarettes    Quit date: 01/14/2014    Years since quitting: 7.5   Smokeless tobacco: Never   Tobacco comments:    1-2 cig a week  Substance and Sexual Activity   Alcohol use: No    Alcohol/week: 0.0 standard drinks    Comment: Socially--1 every 2-3 weeks   Drug use: No    Frequency: 1.0 times per week    Comment: Marijuana occasionally--stopped   Sexual activity: Yes    Partners: Male    Birth control/protection: Condom, Pill  Other Topics Concern   Not on file  Social History Narrative   Exercise-- no   Social Determinants of Health   Financial Resource Strain: Not on file  Food Insecurity: Not on file  Transportation Needs: Not on file  Physical Activity: Not on file  Stress: Not on file  Social Connections: Not on file  Intimate Partner Violence: Not on file   Health Maintenance  Topic Date Due   COVID-19 Vaccine (1) Never done   Hepatitis C Screening  Never done   PAP SMEAR-Modifier  09/07/2019   TETANUS/TDAP  04/30/2026   INFLUENZA VACCINE  Completed   HPV VACCINES  Completed   HIV Screening  Completed   Pneumococcal Vaccine 68-78 Years old  Aged Out    The following portions of the patient's history were reviewed and updated as appropriate: She   has a past medical history of Chicken pox, Frequent headaches, and Migraine. She does not have any pertinent problems on file. She  has a past surgical history that includes Wisdom tooth extraction. Her family history includes Alcohol abuse in her brother, father, paternal grandmother, and paternal uncle; Hypercholesterolemia (age of onset: 52) in her mother; Hypertension (age of onset: 2) in her mother; Sudden death in her paternal uncle; Sudden death (age of onset: 71) in her father; Thyroid disease in her maternal grandfather. She  reports that she quit smoking about 7 years ago. Her smoking use included cigarettes. She has never used smokeless tobacco. She reports that she does not drink alcohol and does not use drugs. She has a current medication list which includes the following prescription(s): fluoxetine and multivitamin. Current Outpatient Medications on File Prior to Visit  Medication Sig Dispense Refill   FLUoxetine (PROZAC) 40 MG capsule Take 1 capsule (40 mg total) by mouth every morning. 90 capsule 1   Multiple Vitamin (MULTIVITAMIN) tablet Take 1 tablet by mouth daily.     No current facility-administered medications on file prior to visit.   She has No Known Allergies..  Review of Systems Review of Systems  Constitutional: Negative for activity change,  appetite change and fatigue.  HENT: Negative for hearing loss, congestion, tinnitus and ear discharge.  dentist q90m Eyes: Negative for visual disturbance (see optho q1y -- vision corrected to 20/20 with glasses).  Respiratory: Negative for cough, chest tightness and shortness of breath.   Cardiovascular: Negative for chest pain, palpitations and leg swelling.  Gastrointestinal: Negative for abdominal pain, diarrhea, constipation and abdominal distention.  Genitourinary: Negative for urgency, frequency, decreased urine volume and difficulty urinating.  Musculoskeletal: Negative for back pain, arthralgias and gait problem.   Skin: Negative for color change, pallor and rash.  Neurological: Negative for dizziness, light-headedness, numbness and headaches.  Hematological: Negative for adenopathy. Does not bruise/bleed easily.  Psychiatric/Behavioral: Negative for suicidal ideas, confusion, sleep disturbance, self-injury, dysphoric mood, decreased concentration and agitation.      Objective:    BP 130/90 (BP Location: Left Arm, Patient Position: Sitting, Cuff Size: Normal)   Pulse 66   Temp 97.9 F (36.6 C) (Oral)   Resp 18   Ht 5\' 5"  (1.651 m)   Wt 180 lb (81.6 kg)   SpO2 98%   BMI 29.95 kg/m  General appearance: alert, cooperative, appears stated age, and no distress Head: Normocephalic, without obvious abnormality, atraumatic Eyes: negative findings: lids and lashes normal, conjunctivae and sclerae normal, and pupils equal, round, reactive to light and accomodation Ears: normal TM's and external ear canals both ears Nose: Nares normal. Septum midline. Mucosa normal. No drainage or sinus tenderness. Throat: lips, mucosa, and tongue normal; teeth and gums normal Neck: no adenopathy, no carotid bruit, no JVD, supple, symmetrical, trachea midline, and thyroid not enlarged, symmetric, no tenderness/mass/nodules Back: symmetric, no curvature. ROM normal. No CVA tenderness. Lungs: clear to auscultation bilaterally Breasts: deferred  Heart: regular rate and rhythm, S1, S2 normal, no murmur, click, rub or gallop Abdomen: soft, non-tender; bowel sounds normal; no masses,  no organomegaly Pelvic: deferred Extremities: extremities normal, atraumatic, no cyanosis or edema Pulses: 2+ and symmetric Skin: Skin color, texture, turgor normal. No rashes or lesions Lymph nodes: Cervical, supraclavicular, and axillary nodes normal. Neurologic: Alert and oriented X 3, normal strength and tone. Normal symmetric reflexes. Normal coordination and gait    Assessment:    Healthy female exam.      Plan:    Ghm  utd Check labs  See After Visit Summary for Counseling Recommendations   1. Preventative health care See above  - CBC with Differential/Platelet - Comprehensive metabolic panel - Lipid panel - TSH  2. Need for hepatitis C screening test  - Hepatitis C antibody  3. Depression with anxiety Stable Con't prozac - FLUoxetine (PROZAC) 40 MG capsule; Take 1 capsule (40 mg total) by mouth every morning.  Dispense: 90 capsule; Refill: 3

## 2021-07-28 LAB — HEPATITIS C ANTIBODY
Hepatitis C Ab: NONREACTIVE
SIGNAL TO CUT-OFF: 0.07 (ref <1.00)

## 2021-10-23 ENCOUNTER — Other Ambulatory Visit (HOSPITAL_BASED_OUTPATIENT_CLINIC_OR_DEPARTMENT_OTHER): Payer: Self-pay

## 2021-10-27 ENCOUNTER — Other Ambulatory Visit (HOSPITAL_BASED_OUTPATIENT_CLINIC_OR_DEPARTMENT_OTHER): Payer: Self-pay

## 2022-02-12 ENCOUNTER — Other Ambulatory Visit (HOSPITAL_BASED_OUTPATIENT_CLINIC_OR_DEPARTMENT_OTHER): Payer: Self-pay

## 2022-05-21 ENCOUNTER — Other Ambulatory Visit (HOSPITAL_BASED_OUTPATIENT_CLINIC_OR_DEPARTMENT_OTHER): Payer: Self-pay

## 2022-06-05 DIAGNOSIS — H5213 Myopia, bilateral: Secondary | ICD-10-CM | POA: Diagnosis not present

## 2022-07-29 ENCOUNTER — Ambulatory Visit (INDEPENDENT_AMBULATORY_CARE_PROVIDER_SITE_OTHER): Payer: 59 | Admitting: Family Medicine

## 2022-07-29 ENCOUNTER — Other Ambulatory Visit (HOSPITAL_BASED_OUTPATIENT_CLINIC_OR_DEPARTMENT_OTHER): Payer: Self-pay

## 2022-07-29 ENCOUNTER — Other Ambulatory Visit (HOSPITAL_COMMUNITY)
Admission: RE | Admit: 2022-07-29 | Discharge: 2022-07-29 | Disposition: A | Discharge status: Home / Self Care | Payer: 59 | Source: Ambulatory Visit | Attending: Family Medicine | Admitting: Family Medicine

## 2022-07-29 ENCOUNTER — Encounter: Payer: Self-pay | Admitting: Family Medicine

## 2022-07-29 VITALS — BP 120/86 | HR 46 | Temp 97.8°F | Resp 18 | Ht 65.0 in | Wt 169.4 lb

## 2022-07-29 DIAGNOSIS — Z124 Encounter for screening for malignant neoplasm of cervix: Secondary | ICD-10-CM

## 2022-07-29 DIAGNOSIS — Z0000 Encounter for general adult medical examination without abnormal findings: Secondary | ICD-10-CM | POA: Diagnosis not present

## 2022-07-29 MED ORDER — AMOXICILLIN 875 MG PO TABS
875.0000 mg | ORAL_TABLET | Freq: Two times a day (BID) | ORAL | 0 refills | Status: DC
  Filled 2022-07-29: qty 20, 10d supply, fill #0

## 2022-07-29 NOTE — Assessment & Plan Note (Signed)
Ghm utd Check labs  See AVS  

## 2022-07-29 NOTE — Progress Notes (Addendum)
Subjective:   By signing my name below, I, Shehryar Baig, attest that this documentation has been prepared under the direction and in the presence of Ann Held, DO. 07/29/2022   Patient ID: Melissa Robles, female    DOB: [birth year], 36 y.o.   MRN: [id number]  Chief Complaint  Patient presents with   Annual Exam    Pt states not fasting     HPI Patient is in today for a comprehensive physical exam.   She denies having any fever, new moles, congestion, sore throat, new muscle pain, new joint pain, chest pain, cough, SOB, wheezing, n/v/d, constipation, blood in stool, dysuria, frequency, hematuria, or headaches at this time.  Her maternal uncle passed away from lung cancer last year. He had past history of using tobacco products. Otherwise she has no changes to her family medical history. She is not using tobacco products at this time.  She is UTD on tetanus vaccines at this time. She has 3 Covid-19 vaccines at this time.   Pap smear: Last completed 09/06/2016. Results are normal. Repeat in 3 years. Due.    Past Medical History:  Diagnosis Date   Chicken pox    Frequent headaches    Migraine     Past Surgical History:  Procedure Laterality Date   WISDOM TOOTH EXTRACTION      Family History  Problem Relation Age of Onset   Hypertension Mother 65   Hypercholesterolemia Mother 85   Alcohol abuse Father    Sudden death Father 53       Unknown cause of death   Alcohol abuse Brother    Thyroid disease Maternal Grandfather    Alcohol abuse Paternal Grandmother    Lung cancer Maternal Uncle    Alcohol abuse Paternal Uncle    Sudden death Paternal Uncle        6 uncles died before they were 39    Lung cancer Paternal Uncle     Social History   Socioeconomic History   Marital status: Married    Spouse name: Not on file   Number of children: Not on file   Years of education: Not on file   Highest education level: Not on file  Occupational History   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   Occupation: Occupational psychologist [employer]  Tobacco Use   Smoking status: Former    Packs/day: 0.25    Years: 10.00    Total pack years: 2.50    Types: Cigarettes    Quit date: 01/14/2014    Years since quitting: 8.5   Smokeless tobacco: Never   Tobacco comments:    1-2 cig a week  Substance and Sexual Activity   Alcohol use: No    Comment: 2-3 a month   Drug use: No    Frequency: 1.0 times per week    Comment: Marijuana occasionally--stopped   Sexual activity: Yes    Partners: Male    Birth control/protection: Condom, Pill  Other Topics Concern   Not on file  Social History Narrative   Exercise-- weight lifting and cardio  30 min daily   Social Determinants of Health   Financial Resource Strain: Not on file  Food Insecurity: Not on file  Transportation Needs: Not on file  Physical Activity: Not on file  Stress: Not on file  Social Connections: Not  on file  Intimate Partner Violence: Not on file    Outpatient Medications Prior to Visit  Medication Sig Dispense Refill   FLUoxetine (PROZAC) 40 MG capsule Take 1 capsule (40 mg total) by mouth every morning. 90 capsule 3   Multiple Vitamin (MULTIVITAMIN) tablet Take 1 tablet by mouth daily.     No facility-administered medications prior to visit.    No Known Allergies  Review of Systems  Constitutional:  Positive for chills. Negative for fever.  HENT:  Positive for congestion and sore throat. Negative for nosebleeds and sinus pain.   Respiratory:  Positive for cough and wheezing. Negative for shortness of breath.   Cardiovascular:  Negative for chest pain and palpitations.  Gastrointestinal:  Negative for abdominal pain, constipation, diarrhea, nausea and vomiting.  Genitourinary:  Negative for dysuria, frequency and hematuria.  Musculoskeletal:        (-)new muscle pain (-)new joint  pain  Skin:        (-)New moles  Neurological:  Negative for headaches.       Objective:    Physical Exam Vitals and nursing note reviewed.  Constitutional:      General: She is not in acute distress.    Appearance: Normal appearance. She is not ill-appearing.  HENT:     Head: Normocephalic and atraumatic.     Right Ear: Tympanic membrane, ear canal and external ear normal.     Left Ear: Tympanic membrane, ear canal and external ear normal.     Mouth/Throat:     Pharynx: Posterior oropharyngeal erythema present. No oropharyngeal exudate.  Eyes:     Extraocular Movements: Extraocular movements intact.     Pupils: Pupils are equal, round, and reactive to light.  Cardiovascular:     Rate and Rhythm: Normal rate and regular rhythm.     Heart sounds: Normal heart sounds. No murmur heard.    No gallop.  Pulmonary:     Effort: Pulmonary effort is normal. No respiratory distress.     Breath sounds: Normal breath sounds. No wheezing or rales.  Abdominal:     General: Bowel sounds are normal. There is no distension.     Palpations: Abdomen is soft.     Tenderness: There is no abdominal tenderness. There is no guarding.  Genitourinary:    Vagina: Normal.     Cervix: Normal.     Comments: Pap smear done Skin:    General: Skin is warm and dry.  Neurological:     Mental Status: She is alert and oriented to person, place, and time.  Psychiatric:        Judgment: Judgment normal.     BP 120/86 (BP Location: Left Arm, Patient Position: Sitting, Cuff Size: Normal)   Pulse (!) 46   Temp 97.8 F (36.6 C) (Oral)   Resp 18   Ht '5\' 5"'$  (1.651 m)   Wt 169 lb 6.4 oz (76.8 kg)   LMP 07/26/2022   SpO2 98%   BMI 28.19 kg/m  Wt Readings from Last 3 Encounters:  07/29/22 169 lb 6.4 oz (76.8 kg)  07/27/21 180 lb (81.6 kg)  11/02/19 180 lb (81.6 kg)       Assessment & Plan:  Preventative health care Assessment & Plan: Ghm utd Check labs  See AVS   Orders: -     CBC with  Differential/Platelet -     Comprehensive metabolic panel -     Lipid panel -     TSH  Cervical cancer  screening -     Cytology - PAP    I, Ann Held, DO, personally preformed the services described in this documentation.  All medical record entries made by the scribe were at my direction and in my presence.  I have reviewed the chart and discharge instructions (if applicable) and agree that the record reflects my personal performance and is accurate and complete. 07/29/2022   I,Shehryar Baig,acting as a scribe for Ann Held, DO.,have documented all relevant documentation on the behalf of Ann Held, DO,as directed by  Ann Held, DO while in the presence of Ann Held, DO.   Ann Held, DO

## 2022-07-30 LAB — TSH: TSH: 2.8 u[IU]/mL (ref 0.35–5.50)

## 2022-07-30 LAB — CBC WITH DIFFERENTIAL/PLATELET
Basophils Absolute: 0.1 10*3/uL (ref 0.0–0.1)
Basophils Relative: 0.6 % (ref 0.0–3.0)
Eosinophils Absolute: 0.1 10*3/uL (ref 0.0–0.7)
Eosinophils Relative: 1.2 % (ref 0.0–5.0)
HCT: 39.8 % (ref 36.0–46.0)
Hemoglobin: 13.4 g/dL (ref 12.0–15.0)
Lymphocytes Relative: 36.3 % (ref 12.0–46.0)
Lymphs Abs: 3.6 10*3/uL (ref 0.7–4.0)
MCHC: 33.6 g/dL (ref 30.0–36.0)
MCV: 95.5 fl (ref 78.0–100.0)
Monocytes Absolute: 0.6 10*3/uL (ref 0.1–1.0)
Monocytes Relative: 6.6 % (ref 3.0–12.0)
Neutro Abs: 5.4 10*3/uL (ref 1.4–7.7)
Neutrophils Relative %: 55.3 % (ref 43.0–77.0)
Platelets: 429 10*3/uL — ABNORMAL HIGH (ref 150.0–400.0)
RBC: 4.17 Mil/uL (ref 3.87–5.11)
RDW: 13 % (ref 11.5–15.5)
WBC: 9.8 10*3/uL (ref 4.0–10.5)

## 2022-07-30 LAB — COMPREHENSIVE METABOLIC PANEL
ALT: 16 U/L (ref 0–35)
AST: 21 U/L (ref 0–37)
Albumin: 4.2 g/dL (ref 3.5–5.2)
Alkaline Phosphatase: 49 U/L (ref 39–117)
BUN: 17 mg/dL (ref 6–23)
CO2: 29 mEq/L (ref 19–32)
Calcium: 9.4 mg/dL (ref 8.4–10.5)
Chloride: 99 mEq/L (ref 96–112)
Creatinine, Ser: 0.89 mg/dL (ref 0.40–1.20)
GFR: 84.36 mL/min (ref >60.00)
Glucose, Bld: 73 mg/dL (ref 70–99)
Potassium: 4.4 mEq/L (ref 3.5–5.1)
Sodium: 136 mEq/L (ref 135–145)
Total Bilirubin: 0.4 mg/dL (ref 0.2–1.2)
Total Protein: 7.4 g/dL (ref 6.0–8.3)

## 2022-07-30 LAB — LIPID PANEL
Cholesterol: 208 mg/dL — ABNORMAL HIGH (ref 0–200)
HDL: 56.3 mg/dL (ref >39.00)
LDL Cholesterol: 122 mg/dL — ABNORMAL HIGH (ref 0–99)
NonHDL: 151.3
Total CHOL/HDL Ratio: 4
Triglycerides: 149 mg/dL (ref 0.0–149.0)
VLDL: 29.8 mg/dL (ref 0.0–40.0)

## 2022-08-02 LAB — CYTOLOGY - PAP
Diagnosis: NEGATIVE
Diagnosis: REACTIVE

## 2022-08-10 ENCOUNTER — Other Ambulatory Visit (HOSPITAL_COMMUNITY): Payer: Self-pay

## 2022-08-10 ENCOUNTER — Other Ambulatory Visit: Payer: Self-pay | Admitting: Family Medicine

## 2022-08-10 DIAGNOSIS — F418 Other specified anxiety disorders: Secondary | ICD-10-CM

## 2022-08-10 MED ORDER — FLUOXETINE HCL 40 MG PO CAPS
40.0000 mg | ORAL_CAPSULE | Freq: Every morning | ORAL | 1 refills | Status: DC
  Filled 2022-08-10 – 2022-10-27 (×3): qty 90, 90d supply, fill #0
  Filled 2022-10-27: qty 90, 90d supply, fill #1

## 2022-08-13 ENCOUNTER — Other Ambulatory Visit (HOSPITAL_BASED_OUTPATIENT_CLINIC_OR_DEPARTMENT_OTHER): Payer: Self-pay

## 2022-08-13 ENCOUNTER — Other Ambulatory Visit (HOSPITAL_COMMUNITY): Payer: Self-pay

## 2022-10-27 ENCOUNTER — Other Ambulatory Visit: Payer: Self-pay

## 2022-10-27 ENCOUNTER — Other Ambulatory Visit (HOSPITAL_COMMUNITY): Payer: Self-pay

## 2023-03-14 ENCOUNTER — Other Ambulatory Visit: Payer: Self-pay

## 2023-03-14 ENCOUNTER — Other Ambulatory Visit (HOSPITAL_COMMUNITY): Payer: Self-pay

## 2023-03-14 ENCOUNTER — Other Ambulatory Visit: Payer: Self-pay | Admitting: Family Medicine

## 2023-03-14 DIAGNOSIS — F418 Other specified anxiety disorders: Secondary | ICD-10-CM

## 2023-03-14 MED ORDER — FLUOXETINE HCL 40 MG PO CAPS
40.0000 mg | ORAL_CAPSULE | Freq: Every morning | ORAL | 1 refills | Status: AC
  Filled 2023-03-14: qty 90, 90d supply, fill #0
